# Patient Record
Sex: Female | Born: 1935 | Race: White | Hispanic: No
Health system: Southern US, Community
[De-identification: ages and names within clinical notes are randomized; demographics above are authoritative.]

---

## 2008-01-27 ENCOUNTER — Encounter: Payer: Self-pay | Admitting: Pulmonary Disease

## 2008-02-01 ENCOUNTER — Encounter: Payer: Self-pay | Admitting: Pulmonary Disease

## 2008-02-04 ENCOUNTER — Encounter: Payer: Self-pay | Admitting: Pulmonary Disease

## 2008-02-08 ENCOUNTER — Ambulatory Visit: Payer: Self-pay | Admitting: Cardiology

## 2008-02-08 LAB — CONVERTED CEMR LAB
BUN: 45 mg/dL — ABNORMAL HIGH (ref 6–23)
Creatinine, Ser: 1.4 mg/dL — ABNORMAL HIGH (ref 0.4–1.2)
Digitoxin Lvl: 1.1 ng/mL (ref 0.8–2.0)
GFR calc Af Amer: 46 mL/min
GFR calc non Af Amer: 38 mL/min
Glucose, Bld: 107 mg/dL — ABNORMAL HIGH (ref 70–99)
Potassium: 4.8 meq/L (ref 3.5–5.1)
Sodium: 144 meq/L (ref 135–145)

## 2008-02-11 ENCOUNTER — Ambulatory Visit: Payer: Self-pay | Admitting: Internal Medicine

## 2008-02-11 ENCOUNTER — Ambulatory Visit: Payer: Self-pay

## 2008-02-11 LAB — CONVERTED CEMR LAB
BUN: 75 mg/dL — ABNORMAL HIGH (ref 6–23)
CO2: 25 meq/L (ref 19–32)
Glucose, Bld: 136 mg/dL — ABNORMAL HIGH (ref 70–99)
INR: 4.7 — ABNORMAL HIGH (ref 0.8–1.0)
Potassium: 5 meq/L (ref 3.5–5.1)

## 2008-02-14 ENCOUNTER — Ambulatory Visit: Payer: Self-pay | Admitting: Cardiology

## 2008-02-16 ENCOUNTER — Ambulatory Visit: Payer: Self-pay | Admitting: Cardiology

## 2008-02-23 ENCOUNTER — Ambulatory Visit: Payer: Self-pay | Admitting: Pulmonary Disease

## 2008-02-23 ENCOUNTER — Ambulatory Visit: Payer: Self-pay | Admitting: Internal Medicine

## 2008-02-23 DIAGNOSIS — J310 Chronic rhinitis: Secondary | ICD-10-CM | POA: Insufficient documentation

## 2008-02-23 DIAGNOSIS — J449 Chronic obstructive pulmonary disease, unspecified: Secondary | ICD-10-CM | POA: Insufficient documentation

## 2008-02-23 DIAGNOSIS — J4489 Other specified chronic obstructive pulmonary disease: Secondary | ICD-10-CM | POA: Insufficient documentation

## 2008-03-02 ENCOUNTER — Ambulatory Visit: Payer: Self-pay | Admitting: Internal Medicine

## 2008-03-07 ENCOUNTER — Ambulatory Visit: Payer: Self-pay | Admitting: Cardiology

## 2008-03-07 LAB — CONVERTED CEMR LAB
CO2: 27 meq/L (ref 19–32)
Chloride: 109 meq/L (ref 96–112)
Creatinine, Ser: 1.2 mg/dL (ref 0.4–1.2)
Free T4: 0.8 ng/dL (ref 0.6–1.6)
Potassium: 4.6 meq/L (ref 3.5–5.1)

## 2008-04-30 DIAGNOSIS — I10 Essential (primary) hypertension: Secondary | ICD-10-CM | POA: Insufficient documentation

## 2008-04-30 DIAGNOSIS — I2510 Atherosclerotic heart disease of native coronary artery without angina pectoris: Secondary | ICD-10-CM | POA: Insufficient documentation

## 2008-04-30 DIAGNOSIS — I4891 Unspecified atrial fibrillation: Secondary | ICD-10-CM | POA: Insufficient documentation

## 2008-04-30 DIAGNOSIS — I5032 Chronic diastolic (congestive) heart failure: Secondary | ICD-10-CM | POA: Insufficient documentation

## 2008-04-30 DIAGNOSIS — N189 Chronic kidney disease, unspecified: Secondary | ICD-10-CM | POA: Insufficient documentation

## 2008-06-20 ENCOUNTER — Encounter: Payer: Self-pay | Admitting: Internal Medicine

## 2008-06-27 ENCOUNTER — Encounter: Payer: Self-pay | Admitting: Internal Medicine

## 2008-06-30 ENCOUNTER — Encounter: Payer: Self-pay | Admitting: Internal Medicine

## 2008-07-13 ENCOUNTER — Encounter: Payer: Self-pay | Admitting: Internal Medicine

## 2008-07-24 ENCOUNTER — Encounter: Payer: Self-pay | Admitting: Internal Medicine

## 2008-11-03 ENCOUNTER — Telehealth: Payer: Self-pay | Admitting: Cardiology

## 2008-11-09 ENCOUNTER — Ambulatory Visit: Payer: Self-pay | Admitting: Internal Medicine

## 2008-11-09 LAB — CONVERTED CEMR LAB
Eosinophils Absolute: 0.2 10*3/uL (ref 0.0–0.7)
Lymphocytes Relative: 5.6 % — ABNORMAL LOW (ref 12.0–46.0)
Lymphs Abs: 0.5 10*3/uL — ABNORMAL LOW (ref 0.7–4.0)
MCV: 86 fL (ref 78.0–100.0)
Monocytes Absolute: 0.7 10*3/uL (ref 0.1–1.0)
Monocytes Relative: 7.6 % (ref 3.0–12.0)
Neutro Abs: 7.5 10*3/uL (ref 1.4–7.7)
Neutrophils Relative %: 85.1 % — ABNORMAL HIGH (ref 43.0–77.0)
RDW: 16 % — ABNORMAL HIGH (ref 11.5–14.6)

## 2008-11-10 ENCOUNTER — Ambulatory Visit: Payer: Self-pay | Admitting: Internal Medicine

## 2008-11-10 ENCOUNTER — Inpatient Hospital Stay (HOSPITAL_COMMUNITY): Admission: EM | Admit: 2008-11-10 | Discharge: 2008-11-17 | Payer: Self-pay | Admitting: Emergency Medicine

## 2008-11-10 ENCOUNTER — Encounter: Payer: Self-pay | Admitting: Internal Medicine

## 2008-11-10 ENCOUNTER — Ambulatory Visit: Payer: Self-pay | Admitting: Cardiovascular Disease

## 2008-11-10 ENCOUNTER — Encounter (INDEPENDENT_AMBULATORY_CARE_PROVIDER_SITE_OTHER): Payer: Self-pay | Admitting: Internal Medicine

## 2008-11-13 ENCOUNTER — Telehealth: Payer: Self-pay | Admitting: Internal Medicine

## 2008-11-15 ENCOUNTER — Ambulatory Visit: Payer: Self-pay | Admitting: Physical Medicine & Rehabilitation

## 2008-11-21 ENCOUNTER — Telehealth: Payer: Self-pay | Admitting: Internal Medicine

## 2008-11-22 ENCOUNTER — Telehealth: Payer: Self-pay | Admitting: Internal Medicine

## 2008-11-27 ENCOUNTER — Encounter: Payer: Self-pay | Admitting: Internal Medicine

## 2008-11-30 ENCOUNTER — Telehealth (INDEPENDENT_AMBULATORY_CARE_PROVIDER_SITE_OTHER): Payer: Self-pay | Admitting: *Deleted

## 2008-12-04 ENCOUNTER — Encounter: Payer: Self-pay | Admitting: Internal Medicine

## 2008-12-10 ENCOUNTER — Encounter: Payer: Self-pay | Admitting: Internal Medicine

## 2008-12-12 ENCOUNTER — Ambulatory Visit: Payer: Self-pay | Admitting: Internal Medicine

## 2008-12-14 ENCOUNTER — Encounter: Payer: Self-pay | Admitting: Internal Medicine

## 2008-12-15 ENCOUNTER — Telehealth: Payer: Self-pay | Admitting: Internal Medicine

## 2008-12-16 ENCOUNTER — Encounter: Payer: Self-pay | Admitting: Internal Medicine

## 2008-12-18 ENCOUNTER — Ambulatory Visit: Payer: Self-pay | Admitting: Internal Medicine

## 2008-12-18 ENCOUNTER — Telehealth (INDEPENDENT_AMBULATORY_CARE_PROVIDER_SITE_OTHER): Payer: Self-pay | Admitting: *Deleted

## 2008-12-18 LAB — CONVERTED CEMR LAB

## 2008-12-20 ENCOUNTER — Encounter: Payer: Self-pay | Admitting: Internal Medicine

## 2008-12-26 ENCOUNTER — Telehealth (INDEPENDENT_AMBULATORY_CARE_PROVIDER_SITE_OTHER): Payer: Self-pay | Admitting: *Deleted

## 2008-12-27 ENCOUNTER — Encounter: Payer: Self-pay | Admitting: Internal Medicine

## 2008-12-28 ENCOUNTER — Inpatient Hospital Stay (HOSPITAL_COMMUNITY): Admission: EM | Admit: 2008-12-28 | Discharge: 2009-01-09 | Payer: Self-pay | Admitting: Emergency Medicine

## 2008-12-28 ENCOUNTER — Ambulatory Visit: Payer: Self-pay | Admitting: Cardiology

## 2009-01-02 ENCOUNTER — Encounter: Payer: Self-pay | Admitting: Internal Medicine

## 2009-01-02 ENCOUNTER — Telehealth: Payer: Self-pay | Admitting: Internal Medicine

## 2009-01-31 ENCOUNTER — Encounter: Payer: Self-pay | Admitting: Internal Medicine

## 2009-02-02 ENCOUNTER — Encounter (INDEPENDENT_AMBULATORY_CARE_PROVIDER_SITE_OTHER): Payer: Self-pay | Admitting: *Deleted

## 2009-02-05 ENCOUNTER — Encounter: Payer: Self-pay | Admitting: Internal Medicine

## 2009-02-05 ENCOUNTER — Telehealth: Payer: Self-pay | Admitting: Internal Medicine

## 2009-02-12 ENCOUNTER — Encounter: Payer: Self-pay | Admitting: Cardiology

## 2009-02-14 ENCOUNTER — Encounter: Payer: Self-pay | Admitting: Internal Medicine

## 2009-02-16 ENCOUNTER — Inpatient Hospital Stay (HOSPITAL_COMMUNITY): Admission: EM | Admit: 2009-02-16 | Discharge: 2009-02-20 | Payer: Self-pay | Admitting: Emergency Medicine

## 2009-02-19 ENCOUNTER — Telehealth: Payer: Self-pay | Admitting: Internal Medicine

## 2009-03-07 ENCOUNTER — Encounter: Payer: Self-pay | Admitting: Internal Medicine

## 2009-03-12 ENCOUNTER — Encounter: Payer: Self-pay | Admitting: Internal Medicine

## 2009-03-19 DEATH — deceased

## 2010-08-22 LAB — DIFFERENTIAL
Basophils Absolute: 0 10*3/uL (ref 0.0–0.1)
Basophils Relative: 1 % (ref 0–1)
Eosinophils Absolute: 0 10*3/uL (ref 0.0–0.7)
Eosinophils Absolute: 0.1 10*3/uL (ref 0.0–0.7)
Eosinophils Absolute: 0.4 10*3/uL (ref 0.0–0.7)
Eosinophils Relative: 0 % (ref 0–5)
Eosinophils Relative: 1 % (ref 0–5)
Lymphocytes Relative: 2 % — ABNORMAL LOW (ref 12–46)
Lymphocytes Relative: 6 % — ABNORMAL LOW (ref 12–46)
Lymphs Abs: 0.6 10*3/uL — ABNORMAL LOW (ref 0.7–4.0)
Lymphs Abs: 0.6 10*3/uL — ABNORMAL LOW (ref 0.7–4.0)
Monocytes Absolute: 1 10*3/uL (ref 0.1–1.0)
Monocytes Relative: 4 % (ref 3–12)
Neutro Abs: 8.3 10*3/uL — ABNORMAL HIGH (ref 1.7–7.7)
Neutrophils Relative %: 79 % — ABNORMAL HIGH (ref 43–77)
Neutrophils Relative %: 89 % — ABNORMAL HIGH (ref 43–77)

## 2010-08-22 LAB — COMPREHENSIVE METABOLIC PANEL
ALT: 10 U/L (ref 0–35)
ALT: 12 U/L (ref 0–35)
AST: 23 U/L (ref 0–37)
Albumin: 3.3 g/dL — ABNORMAL LOW (ref 3.5–5.2)
Alkaline Phosphatase: 73 U/L (ref 39–117)
BUN: 11 mg/dL (ref 6–23)
CO2: 29 mEq/L (ref 19–32)
CO2: 32 mEq/L (ref 19–32)
Calcium: 8.8 mg/dL (ref 8.4–10.5)
Chloride: 102 mEq/L (ref 96–112)
Chloride: 96 mEq/L (ref 96–112)
Creatinine, Ser: 1.28 mg/dL — ABNORMAL HIGH (ref 0.4–1.2)
GFR calc Af Amer: 48 mL/min — ABNORMAL LOW (ref >60)
GFR calc non Af Amer: 39 mL/min — ABNORMAL LOW (ref >60)
GFR calc non Af Amer: 49 mL/min — ABNORMAL LOW (ref >60)
Glucose, Bld: 109 mg/dL — ABNORMAL HIGH (ref 70–99)
Potassium: 3.4 mEq/L — ABNORMAL LOW (ref 3.5–5.1)
Sodium: 139 mEq/L (ref 135–145)
Total Bilirubin: 0.7 mg/dL (ref 0.3–1.2)
Total Bilirubin: 0.9 mg/dL (ref 0.3–1.2)
Total Protein: 6 g/dL (ref 6.0–8.3)

## 2010-08-22 LAB — CBC
HCT: 31.3 % — ABNORMAL LOW (ref 36.0–46.0)
HCT: 34.9 % — ABNORMAL LOW (ref 36.0–46.0)
Hemoglobin: 10.4 g/dL — ABNORMAL LOW (ref 12.0–15.0)
Hemoglobin: 11.6 g/dL — ABNORMAL LOW (ref 12.0–15.0)
MCHC: 33.2 g/dL (ref 30.0–36.0)
Platelets: 322 10*3/uL (ref 150–400)
Platelets: 338 10*3/uL (ref 150–400)
RBC: 3.64 MIL/uL — ABNORMAL LOW (ref 3.87–5.11)
RBC: 4.05 MIL/uL (ref 3.87–5.11)
RBC: 4.24 MIL/uL (ref 3.87–5.11)
RDW: 15.5 % (ref 11.5–15.5)
RDW: 15.5 % (ref 11.5–15.5)
WBC: 25.6 10*3/uL — ABNORMAL HIGH (ref 4.0–10.5)

## 2010-08-22 LAB — URINALYSIS, ROUTINE W REFLEX MICROSCOPIC
Bilirubin Urine: NEGATIVE
Glucose, UA: NEGATIVE mg/dL
Ketones, ur: 15 mg/dL — AB
Nitrite: NEGATIVE
Specific Gravity, Urine: 1.021 (ref 1.005–1.030)
Urobilinogen, UA: 1 mg/dL (ref 0.0–1.0)

## 2010-08-22 LAB — URINE MICROSCOPIC-ADD ON

## 2010-08-22 LAB — BASIC METABOLIC PANEL
BUN: 18 mg/dL (ref 6–23)
CO2: 29 mEq/L (ref 19–32)
Chloride: 104 mEq/L (ref 96–112)
Creatinine, Ser: 1.09 mg/dL (ref 0.4–1.2)

## 2010-08-22 LAB — POCT CARDIAC MARKERS: CKMB, poc: <1 ng/mL — ABNORMAL LOW (ref 1.0–8.0)

## 2010-08-22 LAB — MAGNESIUM: Magnesium: 1.5 mg/dL (ref 1.5–2.5)

## 2010-08-24 LAB — COMPREHENSIVE METABOLIC PANEL
ALT: 11 U/L (ref 0–35)
ALT: 13 U/L (ref 0–35)
AST: 24 U/L (ref 0–37)
Albumin: 3 g/dL — ABNORMAL LOW (ref 3.5–5.2)
Albumin: 3.1 g/dL — ABNORMAL LOW (ref 3.5–5.2)
Albumin: 3.2 g/dL — ABNORMAL LOW (ref 3.5–5.2)
Alkaline Phosphatase: 50 U/L (ref 39–117)
Alkaline Phosphatase: 59 U/L (ref 39–117)
BUN: 25 mg/dL — ABNORMAL HIGH (ref 6–23)
CO2: 33 mEq/L — ABNORMAL HIGH (ref 19–32)
Calcium: 8.1 mg/dL — ABNORMAL LOW (ref 8.4–10.5)
Calcium: 8.6 mg/dL (ref 8.4–10.5)
Creatinine, Ser: 1.24 mg/dL — ABNORMAL HIGH (ref 0.4–1.2)
Creatinine, Ser: 1.24 mg/dL — ABNORMAL HIGH (ref 0.4–1.2)
GFR calc Af Amer: 49 mL/min — ABNORMAL LOW (ref >60)
GFR calc non Af Amer: 36 mL/min — ABNORMAL LOW (ref >60)
GFR calc non Af Amer: 41 mL/min — ABNORMAL LOW (ref >60)
Glucose, Bld: 115 mg/dL — ABNORMAL HIGH (ref 70–99)
Glucose, Bld: 124 mg/dL — ABNORMAL HIGH (ref 70–99)
Potassium: 3.3 mEq/L — ABNORMAL LOW (ref 3.5–5.1)
Sodium: 136 mEq/L (ref 135–145)
Sodium: 140 mEq/L (ref 135–145)
Total Bilirubin: 0.5 mg/dL (ref 0.3–1.2)
Total Bilirubin: 0.7 mg/dL (ref 0.3–1.2)
Total Protein: 5.6 g/dL — ABNORMAL LOW (ref 6.0–8.3)
Total Protein: 5.8 g/dL — ABNORMAL LOW (ref 6.0–8.3)

## 2010-08-24 LAB — DIFFERENTIAL
Basophils Absolute: 0 10*3/uL (ref 0.0–0.1)
Eosinophils Absolute: 0 10*3/uL (ref 0.0–0.7)
Lymphs Abs: 0.8 10*3/uL (ref 0.7–4.0)
Monocytes Relative: 5 % (ref 3–12)

## 2010-08-24 LAB — URINALYSIS, ROUTINE W REFLEX MICROSCOPIC
Bilirubin Urine: NEGATIVE
Glucose, UA: NEGATIVE mg/dL
Hgb urine dipstick: NEGATIVE
Ketones, ur: NEGATIVE mg/dL
Protein, ur: 100 mg/dL — AB
Urobilinogen, UA: 1 mg/dL (ref 0.0–1.0)

## 2010-08-24 LAB — BASIC METABOLIC PANEL
BUN: 17 mg/dL (ref 6–23)
BUN: 19 mg/dL (ref 6–23)
BUN: 21 mg/dL (ref 6–23)
CO2: 31 mEq/L (ref 19–32)
CO2: 33 mEq/L — ABNORMAL HIGH (ref 19–32)
CO2: 30 mEq/L (ref 19–32)
Calcium: 8.6 mg/dL (ref 8.4–10.5)
Calcium: 8.8 mg/dL (ref 8.4–10.5)
Chloride: 97 mEq/L (ref 96–112)
Chloride: 98 mEq/L (ref 96–112)
Creatinine, Ser: 1.21 mg/dL — ABNORMAL HIGH (ref 0.4–1.2)
Creatinine, Ser: 1.22 mg/dL — ABNORMAL HIGH (ref 0.4–1.2)
Creatinine, Ser: 1.24 mg/dL — ABNORMAL HIGH (ref 0.4–1.2)
GFR calc non Af Amer: 41 mL/min — ABNORMAL LOW (ref >60)
Glucose, Bld: 107 mg/dL — ABNORMAL HIGH (ref 70–99)
Glucose, Bld: 107 mg/dL — ABNORMAL HIGH (ref 70–99)
Glucose, Bld: 114 mg/dL — ABNORMAL HIGH (ref 70–99)
Glucose, Bld: 101 mg/dL — ABNORMAL HIGH (ref 70–99)
Potassium: 4.1 mEq/L (ref 3.5–5.1)
Potassium: 3.5 mEq/L (ref 3.5–5.1)
Sodium: 139 mEq/L (ref 135–145)

## 2010-08-24 LAB — CROSSMATCH
ABO/RH(D): A POS
Antibody Screen: NEGATIVE

## 2010-08-24 LAB — CBC
HCT: 27.2 % — ABNORMAL LOW (ref 36.0–46.0)
Hemoglobin: 5.9 g/dL — CL (ref 12.0–15.0)
Hemoglobin: 7.9 g/dL — CL (ref 12.0–15.0)
MCHC: 33.4 g/dL (ref 30.0–36.0)
MCHC: 33.4 g/dL (ref 30.0–36.0)
MCHC: 33.5 g/dL (ref 30.0–36.0)
MCV: 92.7 fL (ref 78.0–100.0)
Platelets: 268 10*3/uL (ref 150–400)
Platelets: 280 10*3/uL (ref 150–400)
Platelets: 318 10*3/uL (ref 150–400)
RBC: 2.54 MIL/uL — ABNORMAL LOW (ref 3.87–5.11)
RBC: 3.65 MIL/uL — ABNORMAL LOW (ref 3.87–5.11)
RDW: 16.7 % — ABNORMAL HIGH (ref 11.5–15.5)
RDW: 21.1 % — ABNORMAL HIGH (ref 11.5–15.5)
WBC: 10.6 10*3/uL — ABNORMAL HIGH (ref 4.0–10.5)
WBC: 11.2 10*3/uL — ABNORMAL HIGH (ref 4.0–10.5)
WBC: 11.6 10*3/uL — ABNORMAL HIGH (ref 4.0–10.5)

## 2010-08-24 LAB — BRAIN NATRIURETIC PEPTIDE: Pro B Natriuretic peptide (BNP): 233 pg/mL — ABNORMAL HIGH (ref 0.0–100.0)

## 2010-08-24 LAB — HEMOGLOBIN AND HEMATOCRIT, BLOOD
HCT: 23.4 % — ABNORMAL LOW (ref 36.0–46.0)
HCT: 27.9 % — ABNORMAL LOW (ref 36.0–46.0)
Hemoglobin: 7.6 g/dL — CL (ref 12.0–15.0)

## 2010-08-24 LAB — URINE MICROSCOPIC-ADD ON

## 2010-08-24 LAB — APTT: aPTT: 44 seconds — ABNORMAL HIGH (ref 24–37)

## 2010-08-24 LAB — PROTIME-INR
INR: 1.2 (ref 0.00–1.49)
INR: 3.7 — ABNORMAL HIGH (ref 0.00–1.49)

## 2010-08-24 LAB — TSH: TSH: 1.135 u[IU]/mL (ref 0.350–4.500)

## 2010-08-24 LAB — DIGOXIN LEVEL: Digoxin Level: 1.4 ng/mL (ref 0.8–2.0)

## 2010-08-24 LAB — ABO/RH: ABO/RH(D): A POS

## 2010-08-25 LAB — CBC
HCT: 30.9 % — ABNORMAL LOW (ref 36.0–46.0)
MCHC: 33.9 g/dL (ref 30.0–36.0)
MCV: 86.4 fL (ref 78.0–100.0)
Platelets: 425 10*3/uL — ABNORMAL HIGH (ref 150–400)
RDW: 16.7 % — ABNORMAL HIGH (ref 11.5–15.5)

## 2010-08-25 LAB — BASIC METABOLIC PANEL
BUN: 23 mg/dL (ref 6–23)
BUN: 28 mg/dL — ABNORMAL HIGH (ref 6–23)
CO2: 28 mEq/L (ref 19–32)
CO2: 29 mEq/L (ref 19–32)
Calcium: 8.5 mg/dL (ref 8.4–10.5)
Chloride: 99 mEq/L (ref 96–112)
Creatinine, Ser: 0.87 mg/dL (ref 0.4–1.2)
GFR calc non Af Amer: 49 mL/min — ABNORMAL LOW (ref >60)
Glucose, Bld: 83 mg/dL (ref 70–99)
Glucose, Bld: 92 mg/dL (ref 70–99)
Potassium: 3.4 mEq/L — ABNORMAL LOW (ref 3.5–5.1)
Sodium: 142 mEq/L (ref 135–145)

## 2010-08-25 LAB — PROTIME-INR: Prothrombin Time: 21.6 seconds — ABNORMAL HIGH (ref 11.6–15.2)

## 2010-08-26 LAB — DIFFERENTIAL
Basophils Absolute: 0 10*3/uL (ref 0.0–0.1)
Basophils Absolute: 0 10*3/uL (ref 0.0–0.1)
Basophils Relative: 0 % (ref 0–1)
Eosinophils Absolute: 0 10*3/uL (ref 0.0–0.7)
Eosinophils Absolute: 0.1 10*3/uL (ref 0.0–0.7)
Eosinophils Relative: 0 % (ref 0–5)
Eosinophils Relative: 1 % (ref 0–5)
Lymphocytes Relative: 2 % — ABNORMAL LOW (ref 12–46)
Monocytes Absolute: 0.5 10*3/uL (ref 0.1–1.0)
Neutrophils Relative %: 87 % — ABNORMAL HIGH (ref 43–77)

## 2010-08-26 LAB — BASIC METABOLIC PANEL
BUN: 27 mg/dL — ABNORMAL HIGH (ref 6–23)
BUN: 34 mg/dL — ABNORMAL HIGH (ref 6–23)
CO2: 28 mEq/L (ref 19–32)
CO2: 30 mEq/L (ref 19–32)
CO2: 31 mEq/L (ref 19–32)
CO2: 32 mEq/L (ref 19–32)
CO2: 32 mEq/L (ref 19–32)
Calcium: 8.3 mg/dL — ABNORMAL LOW (ref 8.4–10.5)
Calcium: 9.4 mg/dL (ref 8.4–10.5)
Chloride: 101 mEq/L (ref 96–112)
Chloride: 103 mEq/L (ref 96–112)
Chloride: 106 mEq/L (ref 96–112)
Chloride: 97 mEq/L (ref 96–112)
Creatinine, Ser: 0.98 mg/dL (ref 0.4–1.2)
Creatinine, Ser: 1.21 mg/dL — ABNORMAL HIGH (ref 0.4–1.2)
GFR calc Af Amer: 51 mL/min — ABNORMAL LOW (ref >60)
GFR calc Af Amer: 52 mL/min — ABNORMAL LOW (ref >60)
GFR calc non Af Amer: 53 mL/min — ABNORMAL LOW (ref >60)
Glucose, Bld: 114 mg/dL — ABNORMAL HIGH (ref 70–99)
Glucose, Bld: 126 mg/dL — ABNORMAL HIGH (ref 70–99)
Glucose, Bld: 160 mg/dL — ABNORMAL HIGH (ref 70–99)
Glucose, Bld: 168 mg/dL — ABNORMAL HIGH (ref 70–99)
Glucose, Bld: 97 mg/dL (ref 70–99)
Potassium: 3.9 mEq/L (ref 3.5–5.1)
Potassium: 3.9 mEq/L (ref 3.5–5.1)
Sodium: 138 mEq/L (ref 135–145)
Sodium: 140 mEq/L (ref 135–145)
Sodium: 144 mEq/L (ref 135–145)

## 2010-08-26 LAB — CBC
HCT: 30.4 % — ABNORMAL LOW (ref 36.0–46.0)
HCT: 31.3 % — ABNORMAL LOW (ref 36.0–46.0)
HCT: 31.5 % — ABNORMAL LOW (ref 36.0–46.0)
Hemoglobin: 10.4 g/dL — ABNORMAL LOW (ref 12.0–15.0)
Hemoglobin: 10.4 g/dL — ABNORMAL LOW (ref 12.0–15.0)
Hemoglobin: 10.5 g/dL — ABNORMAL LOW (ref 12.0–15.0)
Hemoglobin: 11 g/dL — ABNORMAL LOW (ref 12.0–15.0)
Hemoglobin: 11.5 g/dL — ABNORMAL LOW (ref 12.0–15.0)
MCHC: 33.2 g/dL (ref 30.0–36.0)
MCHC: 33.3 g/dL (ref 30.0–36.0)
MCHC: 33.3 g/dL (ref 30.0–36.0)
MCHC: 33.4 g/dL (ref 30.0–36.0)
MCHC: 33.5 g/dL (ref 30.0–36.0)
MCV: 85.1 fL (ref 78.0–100.0)
MCV: 85.7 fL (ref 78.0–100.0)
MCV: 86.4 fL (ref 78.0–100.0)
MCV: 86.6 fL (ref 78.0–100.0)
MCV: 86.8 fL (ref 78.0–100.0)
Platelets: 414 10*3/uL — ABNORMAL HIGH (ref 150–400)
Platelets: 502 10*3/uL — ABNORMAL HIGH (ref 150–400)
Platelets: 523 10*3/uL — ABNORMAL HIGH (ref 150–400)
RBC: 3.62 MIL/uL — ABNORMAL LOW (ref 3.87–5.11)
RBC: 3.65 MIL/uL — ABNORMAL LOW (ref 3.87–5.11)
RBC: 3.8 MIL/uL — ABNORMAL LOW (ref 3.87–5.11)
RDW: 16.5 % — ABNORMAL HIGH (ref 11.5–15.5)
RDW: 16.7 % — ABNORMAL HIGH (ref 11.5–15.5)
RDW: 16.7 % — ABNORMAL HIGH (ref 11.5–15.5)
RDW: 16.9 % — ABNORMAL HIGH (ref 11.5–15.5)
RDW: 17.2 % — ABNORMAL HIGH (ref 11.5–15.5)
WBC: 10 10*3/uL (ref 4.0–10.5)
WBC: 15.6 10*3/uL — ABNORMAL HIGH (ref 4.0–10.5)

## 2010-08-26 LAB — HEMOGLOBIN A1C
Hgb A1c MFr Bld: 5.6 % (ref 4.6–6.1)
Mean Plasma Glucose: 114 mg/dL

## 2010-08-26 LAB — PROTIME-INR
Prothrombin Time: 37.4 seconds — ABNORMAL HIGH (ref 11.6–15.2)
Prothrombin Time: 43.8 seconds — ABNORMAL HIGH (ref 11.6–15.2)

## 2010-08-26 LAB — URINE CULTURE: Colony Count: NO GROWTH

## 2010-08-26 LAB — BLOOD GAS, ARTERIAL
Acid-Base Excess: 3.3 mmol/L — ABNORMAL HIGH (ref 0.0–2.0)
Bicarbonate: 25.5 mEq/L — ABNORMAL HIGH (ref 20.0–24.0)
Drawn by: 317871
O2 Content: 4 L/min
O2 Saturation: 86.7 %
pCO2 arterial: 30.7 mmHg — ABNORMAL LOW (ref 35.0–45.0)
pO2, Arterial: 49.7 mmHg — ABNORMAL LOW (ref 80.0–100.0)

## 2010-08-26 LAB — CARDIAC PANEL(CRET KIN+CKTOT+MB+TROPI)
CK, MB: 1 ng/mL (ref 0.3–4.0)
Relative Index: INVALID (ref 0.0–2.5)
Relative Index: INVALID (ref 0.0–2.5)
Total CK: 24 U/L (ref 7–177)
Troponin I: 0.02 ng/mL (ref 0.00–0.06)

## 2010-08-26 LAB — COMPREHENSIVE METABOLIC PANEL
ALT: 19 U/L (ref 0–35)
AST: 25 U/L (ref 0–37)
CO2: 26 mEq/L (ref 19–32)
Chloride: 102 mEq/L (ref 96–112)
GFR calc Af Amer: >60 mL/min (ref >60)
GFR calc non Af Amer: >60 mL/min (ref >60)
Glucose, Bld: 130 mg/dL — ABNORMAL HIGH (ref 70–99)
Sodium: 139 mEq/L (ref 135–145)
Total Bilirubin: 0.7 mg/dL (ref 0.3–1.2)

## 2010-08-26 LAB — CK TOTAL AND CKMB (NOT AT ARMC)
CK, MB: 1.1 ng/mL (ref 0.3–4.0)
Relative Index: INVALID (ref 0.0–2.5)

## 2010-08-26 LAB — D-DIMER, QUANTITATIVE: D-Dimer, Quant: 0.97 ug/mL-FEU — ABNORMAL HIGH (ref 0.00–0.48)

## 2010-08-26 LAB — URINALYSIS, ROUTINE W REFLEX MICROSCOPIC
Bilirubin Urine: NEGATIVE
Glucose, UA: NEGATIVE mg/dL
Hgb urine dipstick: NEGATIVE
Specific Gravity, Urine: 1.004 — ABNORMAL LOW (ref 1.005–1.030)
pH: 6 (ref 5.0–8.0)

## 2010-08-26 LAB — BRAIN NATRIURETIC PEPTIDE
Pro B Natriuretic peptide (BNP): 242 pg/mL — ABNORMAL HIGH (ref 0.0–100.0)
Pro B Natriuretic peptide (BNP): 291 pg/mL — ABNORMAL HIGH (ref 0.0–100.0)

## 2010-08-26 LAB — SAMPLE TO BLOOD BANK

## 2010-08-26 LAB — CULTURE, BLOOD (ROUTINE X 2)

## 2010-08-26 LAB — DIGOXIN LEVEL: Digoxin Level: 0.6 ng/mL — ABNORMAL LOW (ref 0.8–2.0)

## 2010-08-26 LAB — TSH: TSH: 0.883 u[IU]/mL (ref 0.350–4.500)

## 2010-10-01 NOTE — Discharge Summary (Signed)
[name], [name]              ACCOUNT NO.:  [id number]   MEDICAL RECORD NO.:  [id number]          PATIENT TYPE:  INP   LOCATION:  1430                         FACILITY:  [facility]   PHYSICIAN:  Kela Millin, M.D.DATE OF BIRTH:  1917/04/15   DATE OF ADMISSION:  11/10/2008  DATE OF DISCHARGE:  11/17/2008                               DISCHARGE SUMMARY   ADDENDUM:  This is an addendum to the discharge summary dictated by Dr.  Ramiro Harvest covering the period from June 25 through June 29.   ADDENDUM TO THE HOSPITAL COURSE:  The rest of her hospital stay from  June 30 through today, July 2, has been uneventful.  The patient's  daughter indicated that she would be unable to provide all the  assistance that she would need at home.  [facility] saw the  patient but stated that she would be best suited for a skilled nursing  facility.  Social worker has assisted with her placement at [facility]  and the patient will be discharged to that facility today.  Her atrial  fibrillation has remained controlled.  She was maintained on Coumadin  and her PT/INR was monitored.  Her INR today was 1.6 even after her  Coumadin dose was increased to 3 mg last p.m.  She has been given a dose  of Lovenox today and she is to continue the Coumadin and have her PT/INR  rechecked in the morning by the nursing home physician and her Coumadin  dose to be adjusted accordingly.  The patient also completed the full  antibiotic course for treatment of her pneumonia and will not be  requiring any further antibiotics upon discharge.   DISCHARGE MEDICATIONS:  1. Coumadin 4 mg p.o. x1 today.  She was previously on Coumadin 2 mg      daily and her PT/INR is to be rechecked tomorrow, November 18, 2008, at      the nursing home and her Coumadin dose to be adjusted accordingly      by the nursing home physician.  2. Zebeta 5 mg p.o. b.i.d.  3. Aspirin 81 mg p.o. daily.  4. Crestor 40 mg p.o. daily.  5. Niaspan  500 mg p.o. q.h.s.  6. Colace 100 mg p.o. b.i.d., hold if diarrhea.  7. Prednisone 30 mg p.o. daily x2 more days then decrease to 20 mg      p.o. q.a.m. with food x3 days then decrease to 10 mg p.o. q.a.m.      with food for 3 days and then stop.  8. DuoNeb q.6 h.  9. Lanoxin 0.125 mg p.o. daily.  10.Advair Diskus 250/50 mcg one puff b.i.d.  11.Lexapro 10 mg p.o. q.h.s.  12.Restoril 7.5 mg p.o. q.h.s. p.r.n.  13.Mucinex 1200 mg p.o. b.i.d.  14.Iron sulfate 325 mg p.o. daily.  15.Tessalon Perles 100 mg p.o. t.i.d. p.r.n. cough.  16.Tylenol 650 mg p.o. q.6 h p.r.n.  17.Prilosec 40 mg p.o. daily.  18.Her Toprol and Tiazac were discontinued during this hospital stay      and the patient is to follow up with cardiology outpatient (her  heart rate was controlled on Zebeta and digoxin as above).   FOLLOWUP CARE:  1. Nursing home physician in a.m., she is to have a PT/INR drawn and      her Coumadin dose adjusted accordingly.  2. Dr. Shirlee Latch in 2-4 weeks, call for appointment.  3. Dr. Jetty Duhamel in 2-3 weeks, call for appointment.  4. Palliative care to follow the patient at the nursing facility.   DISCHARGE CONDITION:  Improved.  Stable.      Kela Millin, M.D.  Electronically Signed     ACV/MEDQ  D:  11/17/2008  T:  11/17/2008  Job:  [id number]   cc:   Marca Ancona, MD  [address]  [place] [address]   Duncan Dull, M.D.  Fax: [phone]

## 2010-10-01 NOTE — Consult Note (Signed)
NAMERANETTE, [name]              ACCOUNT NO.:  [id number]   MEDICAL RECORD NO.:  [id number]          PATIENT TYPE:  INP   LOCATION:  1401                         FACILITY:  [facility]   PHYSICIAN:  Gerrit Friends. Dietrich Pates, MD, FACCDATE OF BIRTH:  1916/11/10   DATE OF CONSULTATION:  12/30/2008  DATE OF DISCHARGE:                                 CONSULTATION   PRIMARY CARE PHYSICIAN:  Dr. Shaune Pollack.   PRIMARY CARDIOLOGIST:  Dr. Marca Ancona.   HISTORY OF PRESENT ILLNESS:  An 77 year old woman with multiple serious  medical problems including chronic atrial fibrillation treated with  anticoagulation until recently, recurrent congestive heart failure with  preserved left ventricular systolic function and chronic lung disease  after cigarette consumption of 60 pack-years, now admitted with severe  anemia and GI blood loss.  [name] has had six hospital admissions  within the past 6 months or so, typically with dyspnea.  She presents  now with extreme weakness and dyspnea and is found to have a hemoglobin  of 5.9.  Her daughters report a melanotic stool a few days prior to  admission.  Most recent hemoglobin in the chart was a value of 10.0 in  July.  [name] has had continuing dyspnea with some acute  exacerbations in the hospital.  Chest x-ray on admission showed  pulmonary edema.  She has had no chest discomfort.  She has experienced  no loss of consciousness.   She also had a history of critical aortic stenosis requiring aortic  valve replacement surgery with a bioprosthetic device approximately 10  years ago.  An echocardiogram within the past few months showed normal  left ventricular systolic function and normal function of her  bioprosthetic valve.  Atrial fibrillation was apparently paroxysmal in  the past, but has now been more persistent.  It is unclear the extent to  which this was contributing to her congestive heart failure.  Since  admission to hospital, there apparently  has been concern about  bradycardia.  Reviewing telemetry, I find no significant pauses and no  sustained slow heart rates.  She has had some rapid heart rate up to 120  - 150.  She has no known significant coronary artery disease - cardiac  catheterization at the time of her AVR showed a 60% RCA lesion.  She has  developed mild renal insufficiency in the past when treated with  diuretics.   PAST MEDICAL HISTORY:  Otherwise notable for a history of carcinoma of  the breast with lumpectomy, right axillary dissection and radiation  therapy.  She has had hypertension.  She has recently required  continuous oxygen therapy.   PAST SURGICAL HISTORY:  1. Aortic valve replacement as noted above.  2. Rhinoplasty  3. Total thyroidectomy.  4. Lumpectomy for carcinoma of the breast in 1999.   ALLERGIES:  [name] has no known allergies.   MEDICATIONS:  As listed in the medical reconciliation sheet.   SOCIAL HISTORY:  Widowed with three children; two of her daughters live  locally and assist with her care.  She is currently a home hospice  patient,  but has not yet accepted DNR status.  There is no history of  excessive use of alcohol.   FAMILY HISTORY:  No prominent coronary artery disease.   REVIEW OF SYSTEMS:  Impaired ambulation of late; able to get around the  house with a walker.  She has had no abdominal pain.  She has had no  hematemesis or bright red blood per rectum.  There is a history of  hyperlipidemia.  She notes occasional pedal edema that is never severe.  She has had easy bruising.  There is a history of GERD for which she  takes a PPI.  She follows a regular diet at home.  She requires  corrective lenses for near vision.  She has a chronic rhinitis.  She has  had depression in the past.  All other systems reviewed and are  negative.   PHYSICAL EXAMINATION:  GENERAL:  Very pleasant, fairly sharp, elderly  woman in mild respiratory distress.  VITAL SIGNS:  Temperature is  98.3, heart rate 78 and irregular,  respirations 20, blood pressure 110/50, O2 saturation 98% on 3 liters.  HEENT:  Temporal wasting; bilateral arcus; normal oral mucosa.  NECK:  Moderate jugular venous distention; normal carotid upstrokes with  bilateral bruits.  ENDOCRINE:  No thyromegaly.  HEMATOPOIETIC:  No in no adenopathy.  SKIN:  Multiple ecchymoses; multiple abrasions; bronzed discoloration.  LUNGS:  Rales one third of the way up bilaterally.  CARDIAC:  Irregular rhythm; normal first and second heart sounds;  minimal systolic murmur.  ABDOMEN:  Soft and nontender; normal bowel sounds; no masses; no  organomegaly.  EXTREMITIES:  Trace edema; normal distal pulses.  NEUROLOGIC:  Symmetrical strength and tone; normal cranial nerves.  PSYCHIATRIC:  Normal affect; engaged in conversation; slight difficulty  finding words and with word pronunciation; some impairment in fluent  speech and memory.   STUDIES:  EKG:  Atrial fibrillation with controlled ventricular  response; ST - T-wave abnormality consistent with digoxin effect.  Chest  x-ray:  Mild pulmonary edema.   LABORATORY DATA:  Hemoglobin of 5.9, increasing to 9.3 after  transfusion, but now down to 7.6; normal metabolic profile except for  potassium of 3.5, a digoxin level of 1.3, a now normal INR; value at  admission was 3.7; negative troponin.   IMPRESSION:  [name] presents with severe anemia, which certainly  could account for all of her symptoms; however, she also has recurrent  pulmonary edema, perhaps induced by the stress of her anemia.  She will  require maintenance of a higher hemoglobin, perhaps approximately 9.  I  would recommend transfusion of two additional units.  She will also  require some diuresis.  Intravenous furosemide will be given today and  oral furosemide started tomorrow.  Her potassium will be replaced.  We  will monitor weight, hemoglobin and renal function.  At present, no  diagnostic  testing is planned for her GI bleeding.  As a result, there  will be no identification or treatment of her underlying GI issue.  Accordingly, I would not be inclined to resume her aspirin or warfarin  at any point in the future.   Congestive heart failure has been present in the past and is apparently  unrelated to her prosthetic valve or left ventricular systolic function.  We will diurese her as tolerated.   Family and I had a long discussion about code status with the patient.  She does not appear inclined to accept that her life span appears to  be  quite limited at the present time.  She was variable in her request for  the type of care she would accept were her condition to deteriorate  seriously.  For now, she is not ready to establish a full no code  status.  We will continue to discuss this issue and to follow this nice  woman with you.      Gerrit Friends. Dietrich Pates, MD, [facility]  Electronically Signed     RMR/MEDQ  D:  12/30/2008  T:  12/31/2008  Job:  [id number]

## 2010-10-01 NOTE — Assessment & Plan Note (Signed)
[facility]                            CARDIOLOGY OFFICE NOTE   [name], [name]                       MRN:          [id number]  DATE:02/08/2008                            DOB:          07-31-1916    PRIMARY CARE PHYSICIAN:  Desmond Dike, MD in [place], [place].   HISTORY OF PRESENT ILLNESS:  This is an 77 year old with multiple  medical problems including a history of bovine aortic valve replacement,  breast cancer and coronary artery disease who presents to cardiology  clinic for evaluation of shortness of breath.  The patient states that  over this summer, she feels like she has been doing considerably worse  than the past.  She says she has felt flu-like symptoms for the last  couple of months.  She has been short of breath if she climbs a flight  of stairs, for about 2 months.  She also has been has been getting short  of breath just doing her daily housework like doing the laundry or  cooking dinner.  This is all quite new for her.  She denies having  episodes of chest pain or pressure.  She does not have orthopnea or PND.  She was evaluated initially by a pulmonologist up in [place] where she  lives.  She has a history of mild emphysema, but the pulmonologist did  not think that her lung issues could explain the degree of shortness of  breath she was having.  He then sent her to follow up with her primary  care physician to look for cardiac causes.  Her primary care physician  did an EKG, this was on this last Friday, noted that she was in atrial  flutter and send her to the emergency department.  Her heart rate at  that time was about 130.  In the emergency department, she was rate-  controlled, started on Lasix, she is already on Coumadin and has been  for the past 2 years and discharged.  She states that she continues to  have the same level of shortness of breath today.  She is coming in this  morning for a followup after  her emergency department visit.  The  patient is 77 years old; however, she has been quite functional.  A year  ago, she was playing golf.  She lives 6 months in [place] and 6 months  in the [facility] and she is followed by a cardiologist  down to [place] in  [place].   MEDICATIONS:  1. Diovan 160 mg daily.  2. Diltiazem extended release 120 mg daily.  3. Digoxin 0.125 mg daily.  4. Coumadin, this is followed by her cardiologist in [place].  She has      her labs drawn in [place] and then sends down to [place].  5. Lexapro 10 mg daily.  6. Toprol-XL 50 mg daily.  7. Niacin 500 mg daily.  8. Crestor 20 mg daily.  9. Lasix 20 mg b.i.d (not taking).   PAST MEDICAL HISTORY:  1. History of severe aortic stenosis, status post aortic valve  replacement with a #19 bovine pericardial valve in 1999.  Most      recent echo was done this month, September 2009, while she is in      the hospital this past weekend showing mild LVH, EF of greater than      55%, moderate left atrial enlargement.  There was some moderate      mitral annular calcification, but no evidence of mitral stenosis,      there was mild tricuspid regurgitation.  Pulmonary artery systolic      pressure was 36 mmHg.  There was a bovine aortic valve replacement      with a mean gradient of 12 mmHg that seemed to be functioning      correctly.  There was mild diastolic dysfunction without evidence      of significantly elevated filling pressures.  2. History of coronary artery disease.  The patient has 60% RCA      stenosis on her pre-AVR cath, I'm not sure if she had a bypass      graft.  She did have an exercise Cardiolite in November 2006, which      showed an EF of 89% and no perfusion defects.  3. History of breast cancer.  The patient had lumpectomy with      radiation, but no chemotherapy.  This is about 10 years ago.  4. Hypertension.  5. Apparently mild emphysema.  6. Diastolic congestive heart  failure.  7. New-onset atrial fibrillation.  The patient has been on Coumadin      since 2006.  I am unsure of the indication for this and she does      not know either.  8. Chronic kidney disease.  Last creatinine was 1.26.   SOCIAL HISTORY:  The patient is widowed.  She lives 6 months of the year  in [place] and 6 months of the year in [place].  She is here with her  son today who lives in [place].  She used to smoke but quit 10 years  ago, drinks occasional alcohol not heavily.  She does have 3 children.   FAMILY HISTORY:  Noncontributory.   REVIEW OF SYSTEMS:  Negative except as noted in history present illness.   LABORATORY DATA:  Hematocrit 34.8.  LFTs normal.  This was done in  August 2009.  Other labs in August 2009, sodium 138, potassium 5.5, BUN  32, creatinine 1.26.  TSH was normal.   EKG today shows atrial fibrillation with evidence of digoxin effect with  a rate in the 80s.   PHYSICAL EXAMINATION:  VITAL SIGNS:  Blood pressure 128/70, heart rate  in the 80s, weight is 151 pounds.  GENERAL:  This is an elderly female in no apparent distress.  NEUROLOGIC:  Alert and oriented x3.  There is normal affect.  NECK:  JVP is approximately 8-9 cm of water.  There is no thyromegaly or  thyroid nodule.  CARDIOVASCULAR:  Heart irregular S1 and S2.  No S3.  No S4.  There is a  2/6 systolic crescendo-decrescendo murmur at the right upper sternal  border and there is also a 2/6 murmur at the apex.  There is radiation  of the murmur into in the neck bilaterally.  LUNGS:  Clear to auscultation bilaterally.  EXTREMITIES:  There is no clubbing or cyanosis.  There is trace ankle  edema bilaterally.  HEENT:  Normal exam.  NEUROLOGIC:  Normal exam.  SKIN:  Normal exam.   ASSESSMENT AND PLAN:  This is an 77 year old with history of aortic  valve replacement, coronary artery disease, emphysema who presents to  cardiology clinic for evaluation of new-onset atrial fibrillation with   significant shortness of breath symptoms.  1. Atrial fibrillation.  The patient presents with new-onset atrial      fibrillation.  She is on rate control with diltiazem, digoxin and      Toprol-XL.  Her heart rate is in the 80s-90s on my evaluation this      morning at rest.  She does continue to be significantly short of      breath.  This has been going on for the last couple of months and I      do wonder if this is indeed related to new-onset atrial      fibrillation in the setting of diastolic dysfunction leading to      diastolic congestive heart failure.  As for how to manage this      atrial fibrillation, we will definitely anticoagulate her.  She      actually is already on Coumadin, which she has been on since 2006      [place]      We will continue her on that.  Her rate control at rest is not      quite as good as it should be.  I have suggested today that we      increase her diltiazem ER 240 mg a day, continuing her on her      digoxin and her Toprol-XL to attempt a better rate control.      Cardioversion for her may not be the best idea as on her last      echocardiogram, she has moderate mitral regurgitation, and moderate      left atrial enlargement.  I think that the likelihood is if we      cardioverted her this atrial fibrillation would probably recur in      short order.  We could put her antiarrhythmic medication,  however,      I do not think that I would want to expose her to the potential      toxicities unless we absolute had to.  I think, at this time we      will try to rate control her and see if appropriate rate control      helps her feel somewhat better.  If symptoms remain intractable, we      could attempt a TEE-guided cardioversion; however, this would not      be my first choice.  2. Diastolic congestive heart failure.  This is likely exacerbated by      new-onset atrial fibrillation.  We will  attempt to adequately rate      control the atrial fibrillation.  She was started on Lasix in the      hospital over the weekend; however, she has not been taking it.      She has been ordered for Lasix 20 mg b.i.d.  I will have her      instead take Lasix 40 mg once in the morning.  We will see if this      helps make her feel better.  We will also check a BNP today  3. Bioprosthetic aortic valve.  This valve appeared to be functioning      normally with a normal gradient on an echocardiogram done over the  weekend.  4. Coronary artery disease.  The patient has had significant new-onset      shortness of breath as well as new onset of atrial fibrillation.      She did have coronary artery disease on the cath in 1999, last      stress test was in 2006.  I think, it would be reasonable to obtain      an adenosine Myoview to assess her for any significant perfusion      defects.  We will go ahead and set that up for this week.  5. Chronic kidney disease.  The patient's creatinine was 1.26 back in      August.  I think, we need to repeat that again today especially as      we are going to attempt diuresis with her.     Marca Ancona, MD  Electronically Signed    DM/MedQ  DD: 02/08/2008  DT: 02/08/2008  Job #: [id number]   cc:   Thomas C. Wall, MD, [facility]

## 2010-10-01 NOTE — Discharge Summary (Signed)
[name], [name] NO.:  [id number]   MEDICAL RECORD NO.:  [id number]          PATIENT TYPE:  INP   LOCATION:  1430                         FACILITY:  [facility]   PHYSICIAN:  Ramiro Harvest, MD    DATE OF BIRTH:  [birth year]   DATE OF ADMISSION:  11/10/2008  DATE OF DISCHARGE:                               DISCHARGE SUMMARY   INTERIM DISCHARGE SUMMARY:  This is an interim discharge summary  covering the events between November 10, 2008, through November 14, 2008.   PRIMARY CARE PHYSICIAN:  Duncan Dull, M.D., of [facility].   DISCHARGE DIAGNOSES:  1. Acute hypoxic respiratory failure, multifactorial in nature,      secondary to chronic obstructive pulmonary disease exacerbation,      acute on chronic diastolic heart failure, pneumonia and atrial      fibrillation.  2. Acute on chronic diastolic heart failure.  3. Atrial fibrillation with rapid ventricular response.  4. Pneumonia.  5. Chronic obstructive pulmonary disease exacerbation.  6. Severe aortic stenosis, status post bioprosthetic aortic valve      replacement in 1999.  The patient has a #19 bovine pericardial      prosthesis.  7. Coronary artery disease.  Patient with 60% right coronary artery      stenosis on a pre-aortic valve replacement bypass surgery.      Adenosine Myoview in September 2009 showed no perfusion defect.  8. History of breast cancer 10 years ago, status post lumpectomy and      radiation.  9. History of hypertension.  10.Hyperlipidemia.  11.History of methicillin-resistant Staphylococcus aureus bacteremia.  12.The patient is a Do Not Resuscitate/Do Not Intubate.   Discharge medications will be dictated per discharging physician.  Disposition and followup will be dictated per discharging physician.   CONSULTATIONS DONE:  1. Cardiology consult was done.  The patient was seen in consultation      by Dr. Shirlee Latch of [facility] on November 10, 2008.  2. Critical care/pulmonary  medicine consult was done.  The patient was      seen by Dr. [name] on November 10, 2008.  3. A palliative care consult was done.  The patient was seen in      consultation by Dr. Barbee Shropshire on November 14, 2008.   PROCEDURES PERFORMED:  1. A chest x-ray was done on November 10, 2008, that showed worsening      interstitial and air space pulmonary edema since yesterday and      enlarging bilateral pleural effusions, associated passive      atelectasis in the left lower lobe, baseline pulmonary fibrosis      suspected.  2. CT of the chest with contrast was done on November 10, 2008, that      showed dominant finding of severe and diffuse central lobular      emphysema.  Mild patchy airspace disease in the right upper lobe      could be due to early bronchopneumonia.  Scattered calcified      pulmonary nodules are likely due to old granulomatous disease.  A  0.6-cm subpleural nodule on the right.  If the patient is at high      risk for bronchogenic carcinoma, followup chest CT in 6-12 months      is recommended.  If the patient is low-risk, a followup CT 12      months is recommended.  3. A 2-D echo was done on November 10, 2008, that showed a vigorous left      ventricular systolic function, EF of 65-70%.  Aortic valve      prosthesis appears to be opening well.  Mean gradient through the      prosthesis is 18 mmHg.  Mildly thickened mitral valve leaflets,      moderate tricuspid valvular regurgitation, PA peak pressure was 39      mmHg.   BRIEF ADMISSION HISTORY AND PHYSICAL:  [name] is an 77-year-  old white female with a history of atrial fibrillation, severe COPD and  chronic diastolic heart failure, who was seen by PCP earlier in the week  and subsequently referred to the [facility] pulmonary doctor and seen on  November 09, 2008, with increasing hypoxia and dyspnea.  The patient had  been hospitalized 4 times this past year in [place] for multiple COPD  exacerbation as well as MRSA  bacteremia.  Per daughter, a lung mass was  found on CT of February 2010, which was found to be benign.  The  patient's daughter had been calling EMS 3 times during the week of  admission due to worsening shortness of breath, responding to increased  O2 requirements.  The patient also presents with a chronic productive  cough, occasional palpitations with a heart rate in the 170s on the day  of admission and positive orthopnea.  The patient denied any recent  fever, no chest pain, no abdominal pain, no nausea, no vomiting, no  diarrhea, no dysuria, no generalized weakness, no lower extremity edema.  When the patient was seen in the ED, the patient had improved  symptomatically in terms of her shortness of breath after being put on  IV diltiazem as well as being given some Lasix.   PHYSICAL EXAM ON ADMISSION:  A temperature of 98.7, blood pressure  134/84, pulse of 112, respiratory rate 24, saturating 79% on 4 liters  initially, up to 97% on 50% FIO2.  GENERAL:  The patient was an elderly white female, awake, alert, in no  apparent distress.  HEENT: Normocephalic, atraumatic.  Pupils equal, round and reactive to  light and accommodation.  Extraocular movements intact.  Oropharynx was  clear, no lesions, no exudates.  NECK:  Supple.  No lymphadenopathy.  RESPIRATORY:  Breath sounds were diminished throughout.  The patient did  have diffuse expiratory wheezes, some increased work of breathing and  some scattered rales in the left lower basilar lobes.  CARDIOVASCULAR:  Irregularly irregular with a 2/6 systolic ejection  murmur.  ABDOMEN:  Soft, nontender, nondistended, positive bowel sounds.  No  hepatosplenomegaly.  EXTREMITIES:  No clubbing, cyanosis or edema.  NEUROLOGIC:  The patient was alert and oriented x3.  Cranial nerves II-  XII were grossly intact.  No focal deficits.   ADMISSION LABS:  CBC:  White count 10, hemoglobin 11.0, platelets of  415, hematocrit 32.4, ANC of 8.7.   D-dimer was 0.97.  INR of 2.7.  Digoxin of 0.6.  Point of care cardiac markers were negative x1.  Chest  x-ray as stated above.  ABG with a pH of 7.53, pCO2 of 30  and a pO2 of  49.7.  A sodium of 139, potassium 3.5, chloride 102, bicarb 26, BUN 6,  creatinine 0.76, glucose of 130.   HOSPITAL COURSE:  1. Acute hypoxic respiratory failure.  The patient was admitted with      acute hypoxic respiratory failure which was felt to be      multifactorial in nature.  The patient was admitted to the step-      down unit.  It was felt the patient's acute hypoxic respiratory      failure was multifactorial in nature secondary to an acute on      chronic diastolic heart failure, probable early pneumonia, atrial      fibrillation and a COPD exacerbation.  The patient was placed on      strict I's and O's and daily weights.  Cardiac enzymes were cycled      x3, which came back negative.  The patient was placed on IV      diuretics of Lasix.  The patient was also placed on IV steroid and      subsequently underwent a steroid taper as well as oxygen, Mucinex      and nebulizer treatments.  She was also started on empiric therapy      of Avelox 400 mg IV daily and placed on beta blocker.  A critical      care/pulmonary consultation was done.  The patient was seen in      consultation by Dr. Maple Hudson on November 10, 2008.  A cardiology consult      was also done.  The patient was seen by Dr. Shirlee Latch on November 10, 2008.  A 2-D echo was also done with results as stated above.  The      patient started to improve clinically on a daily basis with a      decrease in O2 requirements with these treatments.  The patient      diuresed well and Lasix was held for a couple of days secondary to      increasing creatinine and clinical improvement in terms of acute on      chronic diastolic failure.  The patient was subsequently then      placed on a maintenance dose of Lasix at 20 mg daily.  The      patient's IV steroids  were tapered down and the patient was placed      on oral steroids, which she was tolerating and which will be      tapered on discharge.  In terms of the patient's atrial      fibrillation, the patient was subsequently placed on Zebeta for      rate control as well as digoxin for rate control and aspirin.      Cardiology was consulted.  The patient was seen in consultation by      Dr. Shirlee Latch and the patient was followed during the hospitalization.      The patient improved and as such was transferred from the step-down      unit to the telemetry unit, where the patient remained stable and      was at her baseline as of November 14, 2008, only requiring 2-3 liters      of oxygen per nasal cannula, which is her home dose of oxygenation.      The patient as of November 14, 2008, was in stable and improved      condition.  2. Acute on chronic diastolic heart failure.  The patient was noted to      be in acute on chronic diastolic heart failure on admission, which      was felt to be one of the etiologies of her acute respiratory      failure.  A BNP which was obtained was slightly elevated at 291.      The patient was placed on a strict I's and O's, daily weights and      diuresed with IV Lasix with good response.  Lasix was subsequently      held secondary to worsening renal function and clinical improvement      and subsequently restarted on maintenance dose of Lasix 20 mg      orally daily.  The patient improved and as of November 14, 2008, the      patient's acute on chronic diastolic heart failure had been well-      compensated.  3. Atrial fibrillation with rapid ventricular response.  The patient      was noted to be in AFib with RVR on admission.  Per family, the      patient had a heart rate in the 170s.  In the ED the patient's      heart rate was in the 130s to the 140s.  The patient was placed on      an IV diltiazem drip, which was subsequently switched to off and      the patient placed  on an oral beta blocker with good rate control.      Digoxin was also added to her regimen.  Cardiology was consulted.      The patient was seen in consultation by Dr. Shirlee Latch.  The patient      was rate-controlled with Zebeta and digoxin and placed on aspirin.      The patient will follow up with cardiology as an outpatient.  As of      November 14, 2008, the patient was in stable condition.  4. Pneumonia.  On admission, a chest CT was done secondary to an      increased D-dimer level with results as stated above.  It was noted      that the patient may have an early bronchogenic pneumonia ongoing      and subsequently placed on empiric Avelox for this.  Sputum      cultures were unable to be obtained secondary to patient inability      to produce a sputum.  The patient remained afebrile and the patient      improved symptomatically.  As of November 14, 2008, the patient was in      stable and improved condition and had been switched to oral Avelox.      The patient will complete a 1-week course of antibiotics for her      early bronchogenic pneumonia.  5. Chronic obstructive pulmonary disease exacerbation.  On admission      it was felt that the patient's acute hypoxic respiratory failure      was in part secondary to a COPD exacerbation.  The patient does      have a history of severe COPD that was on home O2.  The patient was      placed on oxygen as well as Mucinex and nebulizer treatments as      well as initially on IV Solu-Medrol, which was subsequently      converted to oral steroids.  The patient's wheezing improved and      the patient was no longer wheezing as of November 14, 2008.  The      patient's O2 requirement improved down to 2-3 liters nasal cannula.      Pulmonary was consulted.  The patient was seen in consultation by      Dr. Maple Hudson of [facility], who followed her during the      hospitalization.  The patient improved with a steroid taper, was      also on Avelox for the  presumed bronchogenic pneumonia.  The      patient will finish a 7-day course of antibiotics.  The patient      improved on a daily basis and as of November 14, 2008, the patient was      in stable and improved condition.  A palliative care consult was      obtained secondary to the patient's severe COPD exacerbation.  The      patient was seen in consultation by Dr. Barbee Shropshire on November 14, 2008,      and it was recommended that an addition of Ativan be made to the      patient's medications for as-needed anxiety-related shortness of      breath as well as the recommendation of Roxanol as well for as-      needed shortness of breath.  It was felt the patient was hospice-      eligible with an end-stage COPD, and the patient will be followed      as an outpatient by hospice when discharged.   The rest of the patient's chronic medical issues were stable throughout  the hospitalization.   Discharge medications, disposition and followup will be dictated per  discharging physician.   It was a pleasure taking care of [name].      Ramiro Harvest, MD  Electronically Signed     DT/MEDQ  D:  11/16/2008  T:  11/16/2008  Job:  [id number]   cc:   Duncan Dull, M.D.  Fax: [phone]   Marca Ancona, MD  [address]  [place] [address]   Rennis Chris. Maple Hudson, MD, FCCP, FACP  [facility]  [address]  [place]  [place] [address]

## 2010-10-01 NOTE — Consult Note (Signed)
[name], Jeanette NO.:  [id number]   MEDICAL RECORD NO.:  [id number]          PATIENT TYPE:  INP   LOCATION:  1233                         FACILITY:  [facility]   PHYSICIAN:  Marca Ancona, MD      DATE OF BIRTH:  [birth year]   DATE OF CONSULTATION:  DATE OF DISCHARGE:                                 CONSULTATION   PULMONOLOGIST:  Dr. [name].   HISTORY OF PRESENT ILLNESS:  This is an 77 year old with a history of  severe COPD, chronic atrial fibrillation and in diastolic heart failure  who presented to [facility] with atrial fibrillation and rapid  ventricular response as well as severe shortness of breath.  The patient  has recently been hospitalized 4 times in 2010 for COPD exacerbations in  [place], [place].  Once the patient actually had MRSA bacteremia.  Over the  last few weeks, the patient has been back in [place]. She has had  shortness of breath with minimal exertion over the last 3 days.  She has  been short of breath at rest.  She has not had any fever. She has been  worse in the morning.  Her daughters have called EMS for 3 mornings in a  row.  The first 2 mornings, she refused hospitalization stay; however,  she came in to the Emergency Department.  Yesterday, she saw Dr. Maple Hudson  who found her oxygen saturation was in the 80% range on 2 L nasal  canula.  He increased her oxygen to 4 L by nasal canula.  In the  Emergency Department today, the patient was found to be in atrial  fibrillation with a heart rate in the 120s-130s.  Per her daughter, it  was as high as the 170s this morning when they checked her heart rate.  The patient did have evidence of some pulmonary edema on chest x-ray.  Her BNP was mildly elevated.  The patient was given Lasix 40 mg IV and  was admitted to the hospital.  She did have a CT of her chest showing a  possible right upper lobe pneumonia.   PAST MEDICAL HISTORY:  1. Severe aortic stenosis status post  bio-prosthetic aortic valve      replacement in 1999.  She has a #19 bovine pericardial prosthesis.      Last echo was in September 2009, showed moderate LVH EF greater      than 55%, moderate left atrial enlargement and moderate mitral      regurgitation and a mean gradient across the aortic valve 12 mmHg.  2. Coronary artery disease.  The patient had 60% RCA stenosis on her      pre-aortic valve replacement bypass surgery.  Adenosine Myoview in      September 2009 showed no perfusion defect.  3. History of breast cancer.  This was 10 years ago.  The patient had      lumpectomy and radiation.  4. Atrial fibrillation.  The patient had onset of atrial fibrillation      in September 2009.  She has been on Coumadin and has been  rate      controlled.  5. Diastolic congestive heart failure.  6. Chronic kidney disease.  7. COPD.  This is apparently severe.  The patient has been on home      oxygen and has had multiple COPD exacerbations recently.  8. Hypertension.  9. Hyperlipidemia.  10.History of MRSA bacteremia.  11.DNR/DNI.   SOCIAL HISTORY:  The patient is widowed.  Her son and daughter all live  here in [place].  She is now staying with them.  She quit smoking in  2000.   FAMILY HISTORY:  Noncontributory.   CURRENT MEDICATIONS:  1. Aspirin 81 mg daily.  2. Digoxin 0.125 mg daily.  3. Diltiazem drip at 5 mg and hour.  4. Warfarin .  5. Lexapro.  6. Advair.  7. Lasix 40 mg IV b.i.d.  The patient has not been on Lasix at home.  8. Toprol XL 50 mg b.i.d.  9. Moxifloxacin 400 IV daily.  10.Niaspan extended release.   PHYSICAL EXAMINATION:  VITAL SIGNS:  Blood pressure 134/84, heart rate  initially, on arrival, was 112 in atrial fibrillation, it is down to 78  is in atrial fibrillation on diltiazem drip.  Temperature 98.7, oxygen  saturation 79% on 4 L nasal canula, now 97% on 50% face mask.  GENERAL:  This is a frail, elderly female in no apparent distress.  NEUROLOGIC::   Alert and oriented x3, normal affect.  NECK:  JVD is mildly elevated at 8-9 cm of water.  There is no  thyromegaly or thyroid nodule.  HEENT:  Normal.  LUNGS:  Decreased breath sounds bilaterally with no wheezes.  CARDIOVASCULAR:  Heart rate irregular, S1, S2.  There is a 2/6 systolic  crescendo, decrescendo, early to mid peaking systolic murmur at the  right upper sternal border.  There is no peripheral edema.  There are 1+  posterior tibial pulses bilaterally.  There is no carotid bruit.  ABDOMEN:  Soft, nontender, no hepatosplenomegaly, normal bowel sounds.  EXTREMITIES:  No clubbing or cyanosis.  SKIN:  Normal exam.  MUSCULOSKELETAL:  Normal exam.   LAB WORK:  BNP 291.  Digoxin 0.6.  Cardiac enzymes negative x2.  Potassium 3.5, creatinine 0.76.  INR 2.7.  LFTs normal.  Hematocrit  32.4, white count 10.   RADIOLOGY:  CT chest done today shows severe emphysema, possible right  upper lobe pneumonia.  Chest x-ray shows intracranial and air space  pulmonary edema.  There may be underlying pulmonary fibrosis.  The  patient has not yet had an EKG done.   ASSESSMENT/PLAN:  This is an 77 year old with history of severe chronic  obstructive pulmonary disease, atrial fibrillation, diastolic congestive  heart failure who presented with respiratory distress.  1. Pulmonary.  The patient presented with respiratory distress and      hypoxia in the setting of atrial fibrillation with rapid      ventricular response.  She has a history of chronic obstructive      pulmonary disease on home oxygen and frequency of exacerbations.      BNP is mildly elevated.  Chest x-ray shows a component of pulmonary      edema with a questionable background of pulmonary fibrosis.  She      may have right upper lobe pneumonia as well on CT read.  It appears      that the patient may have developed diastolic congestive heart      failure in the setting of atrial fibrillation with rapid  ventricular response  with a baseline of poor pulmonary reserve.  On      the other hand, I do wonder if she is having a chronic obstructive      pulmonary disease exacerbation/pneumonia with hypoxia driving the      atrial fibrillation.  The patient is not wheezing, however.  I      would agree with Lasix 40 mg IV b.i.d. to diurese her.  She has      some volume overload but not severe.  She may not need much IV      Lasix.  I would follow her creatinine closely, control her heart      rate well.  I would treat her possible pneumonia with moxifloxacin      as you are doing, follow blood cultures.  Given her underlying      chronic obstructive pulmonary disease, I wonder if steroids would      be helpful; however, she is not wheezing.  I will leave this up to      pulmonary.  2. Atrial fibrillation.  She is on Toprol XL and diltiazem drip with      good rate control.  We will transition to p.o. meds in the morning.      We will continue her Coumadin.  3. Bioprosthetic aortic valve.  Would do an echo to assess the valve.      She has had recent methicillin resistant Staph aureus bacteremia.      Make sure there is no obvious vegetation.      Marca Ancona, MD  Electronically Signed     DM/MEDQ  D:  11/10/2008  T:  11/10/2008  Job:  [id number]

## 2010-10-01 NOTE — Assessment & Plan Note (Signed)
[name] - [facility]                            CARDIOLOGY OFFICE NOTE   RAINN, ZUPKO                       MRN:          [id number]  DATE:03/07/2008                            DOB:          1916-11-07    PRIMARY CARE PHYSICIAN:  Dr. Willaim Sheng.   HISTORY OF PRESENT ILLNESS:  This is an 77 year old with multiple  medical problems including history of bovine aortic valve replacement,  breast cancer, and coronary artery disease who presents to Cardiology  Clinic for a followup evaluation of atrial fibrillation and diastolic  congestive heart failure.  Since the last appointment, the patient has  been doing a bit better.  She is somewhat less short of breath with  better rate control.  Her heart rate at rest today is 75.  She still is  short of breath after walking about 100 feet.  She states she can climb  a flight of stairs, but she is short of breath at the top.  She is  walking around in her daughter's house without any significant shortness  of breath.  She has had no chest pain.  In general, she is feeling  somewhat better.  She did have an adenosine Myoview at the end of last  month that showed no perfusion defect and additionally she stopped the  Levoxyl that she had been started on by her primary care physician.  She  thought maybe this was leading to some of the problems that she was  having.  Since she last saw me, the patient was seen by Neurology  because of an episode of speech difficulty.  The neurologist at [facility] did  not have any recommendation for further imaging.  He thought she was  doing okay.  She was also seen by Pulmonology, who started her on  Spiriva for her mild COPD.   MEDICATIONS CURRENTLY:  1. Digoxin 0.125 mg daily.  2. Coumadin.  3. Lexapro 10 mg daily.  4. Toprol-XL 50 mg b.i.d.  5. Niacin 500 mg daily.  6. Diltiazem CD 240 mg daily.  7. Spiriva daily.  The patient has stopped Lasix since last      appointment because  her creatinine rose to 2.   PAST MEDICAL HISTORY:  1. History of severe aortic stenosis status post aortic valve      replacement with a #19 bovine pericardial valve in 1999.  Most      recent echo was done in September 2009, and showed mild LVH, EF      greater than 55%, moderate left atrial enlargement.  There was      moderate mitral annular calcification.  There was no evidence of      mitral stenosis, but there was moderate mitral regurgitation.      Pulmonary artery systolic pressure was 36 mmHg.  There was a bovine      aortic valve replacement with a mean gradient of 12 mmHg that      seemed to be functioning normally.  There was mild diastolic      dysfunction without evidence of significantly elevated filling  pressures.  2. History of coronary artery disease.  The patient has had a 60% RCA      stenosis on her pre-aVR cath.  I am not sure if she had a bypass      graft done at the time of her aortic valve replacement.  She had an      adenosine Myoview in September 2009 that showed no perfusion defect      and it was a negative study.  3. History of breast cancer.  The patient had lumpectomy with      radiation, but no chemotherapy about 10 years ago.  4. Atrial fibrillation.  The patient had new-onset atrial fibrillation      in September 2009.  She is on Coumadin.  She is being rate      controlled, actually requiring digoxin, Toprol-XL, and diltiazem      CD.  5. Hypertension.  6. Mild emphysema.  7. Diastolic congestive heart failure.  8. Chronic kidney disease.  Baseline creatinine is about 1.3.  After      starting her Lasix, her creatinine rose to 2.0, and her Lasix has      been stopped.   SOCIAL HISTORY:  The patient is widowed.  She lives 6 months of the year  in [place] and 6 months of the year in [place].  She is here with her  daughter and son-in-law, they live in [place].  She used to smoke,  but quit 10 years ago.  Drinks occasional alcohol, not  heavily.  She  does have 3 children.   LABORATORY DATA:  Her most recent labs; potassium 5, BUN 75, creatinine  2, BNP 316, and digoxin 1.1.   PHYSICAL EXAMINATION:  VITAL SIGNS:  Blood pressure 115/66, heart rate  75 and irregular, weight is 151 pounds, this is stable compared to prior  appointment.  GENERAL:  This is an elderly female in no apparent distress.  NEUROLOGIC:  Alert and oriented x3.  There is normal affect.  NECK:  JVP is not elevated.  There is no thyromegaly or thyroid nodule.  HEART:  Irregular.  S1 and S2.  No S3, no S4.  There is a 2/6 systolic  crescendo-decrescendo murmur at the right upper external border.  There  is also a 2/6 murmur at the apex.  There is radiation of the murmur into  the neck bilaterally.  There is no peripheral edema.  There is no  carotid bruit.  LUNGS:  Clear to auscultation bilaterally with normal respiratory  effort.   ASSESSMENT AND PLAN:  This is an 77 year old with history of aortic  valve replacement, coronary artery disease, and emphysema, who presents  to Cardiology Clinic for evaluation of new-onset atrial fibrillation  with significant shortness of breath symptoms.  1. Atrial fibrillation.  The patient is better rate-controlled now, on      diltiazem, digoxin, and Toprol-XL.  Her heart rate is in the low-to-      mid 70s on my evaluation this morning at rest.  Her shortness of      breath is considerably better since last appointment; however, she      is still short of breath after walking about a 100 feet.  I think      that her symptoms are due to diastolic dysfunction in the setting      of atrial fibrillation.  I think it is best to anticoagulate and      rate control her.  In all  likelihood,  if we were to cardiovert      her, atrial fibrillation would probably recur in fairly short order      given her significantly enlarged left atrium and her moderate      mitral regurgitation.  We could put her on anti-arrhythmic       medicine; however, I do not think I would want to expose her to      potential toxicities unless we absolutely had to.  At this time, as      her symptoms have somewhat resolved with better rate control, we      will continue this strategy.  I do not like having to have her on 3      nodal-blocking agents; however, right now this is what is requiring      to keep her heart rate within reasonable limits.  2. Diastolic congestive heart failure.  This is likely exacerbated by      the patient's atrial fibrillation.  Her shortness of breath is      improved with better rate control.  She does appear euvolemic      today.  She was on Lasix in the past and her creatinine rose to 2.      We have stopped the Lasix.  We are going to recheck a chem-7 today      to make sure that her creatinine and her potassium are decreasing.      She looks euvolemic.  3. Bioprosthetic aortic valve.  The valve appears to be functioning      normally with normal gradient on echo done in September.  4. Coronary artery disease.  The patient did have an adenosine Myoview      done last month that showed no perfusion defects.  5. Question of hypothyroidism.  The patient was started on Levoxyl by      her primary care physician.  She did stop it on her own accord.  I      am going to go ahead and check her TSH and a free T4 to see where      that stands.     Marca Ancona, MD  Electronically Signed    DM/MedQ  DD: 03/07/2008  DT: 03/08/2008  Job #: [id number]   cc:   Dr. Peri Maris

## 2010-10-01 NOTE — H&P (Signed)
NAMESHAWNELLE, SPOERL              ACCOUNT NO.:  [id number]   MEDICAL RECORD NO.:  [id number]          PATIENT TYPE:  INP   LOCATION:  0101                         FACILITY:  [facility]   PHYSICIAN:  Beckey Rutter, MD  DATE OF BIRTH:  07/05/1916   DATE OF ADMISSION:  12/28/2008  DATE OF DISCHARGE:                              HISTORY & PHYSICAL   PRIMARY CARE PHYSICIAN:  Duncan Dull, M.D.   CHIEF COMPLAINT:  Weakness and tarry stools.   HISTORY OF PRESENT ILLNESS:  This is an 77 year old, very pleasant,  Caucasian female with past medical history significant for bovine aortic  valve replacement in 1999, breast cancer, status post chemotherapy and  radiation, atrial fibrillation and diastolic congestive heart failure,  coronary artery disease, hypertension, chronic kidney disease, pulmonary  hypertension,  depression, COPD, presented secondary to weakness.  The  condition started when she was outside the [place] in the family [place].  She was then progressively becoming weaker and weaker over the span of 3  days.  Yesterday the family had to take her had to carry her from bed to  chair to ambulate her.  Usually the baseline is able to ambulate with  walker as long as she has her oxygen attached to her during ambulation.  The patient had bulky, black tarry stool yesterday and the patient was  following up with hospice nurse who advised them to bring the patient to  the hospital secondary to the significant weakness and tarry stools.   In the emergency department, the patient was guaiac positive and her  hemoglobin was 5.9.  The patient denied any fever, cough, chills or  headaches.  The shortness of breath is usual to her as a baseline,  although the patient has a feeling of air hunger currently.  She denied  any pain including abdominal pain.   PAST MEDICAL HISTORY:  1. The patient on chronic Coumadin and she was on aspirin for aortic      valve replacement with a bovine valve in  1999.  2. History of atrial fibrillation with transient interventricular      response.  3. Diastolic heart failure.  4. Coronary artery disease.  5. Hypertension.  6. Chronic kidney disease with baseline 1.3.  7. Pulmonary hypertension with pulmonary artery systolic of 36 on echo      of September 2009.  8. Depression.  9. COPD.  The patient is on home oxygen.  10.History of breast lumpectomy in 2007.  11.History of partial thyroidectomy.   SOCIAL HISTORY:  Mrs. Tipps lives with her daughter at home.  She  denied tobacco abuse, ethanol abuse or drug abuse.   FAMILY HISTORY:  Family history is noncontributory.   ALLERGIES:  Not known to have medication allergy.   MEDICATIONS:  1. The patient on home oxygen 2-4 liters as needed.  2. Colace 100 mg daily.  3. Coumadin.  4. Crestor 40 mg daily.  5. Diazepam 0.5-1 mg p.r.n.  6. Albuterol/ipratropium 0.5/2.5 p.r.n.  7. Iron 325 mg daily.  8. Lanoxin 0.125 daily.  9. Lexapro 10 mg at bedtime.  10.Metoprolol  tartrate 25 mg b.i.d.  11.Mucinex maximum strength q.12 h.  12.Niaspan 500 mg at bedtime.  13.Oxycodone 5 mg p.r.n.  14.Prilosec 40 mg daily.  15.Senna 8.6 p.r.n.  16.Tessalon Perles 100 mg as needed.  17.Tylenol 650 mg as needed.   REVIEW OF SYSTEMS:  The patient denies fevers, chills, headache.  She  denied also abdominal pain.  The patient noticed some heart racing.  Otherwise, the rest of the system review is negative for examination.   PHYSICAL EXAMINATION:  VITAL SIGNS:  Her temperature was lying on 98.3,  blood pressure is 126.67, pulse 107, respiratory rate is 36.  HEENT:  Atraumatic, normocephalic.  PERRL.  The conjunctivae are pale.  Mouth is  dry with pale mucosa.  NECK:  Supple.  No JVD.  LUNGS:  Bilateral decreased air entry.  HEART:  Precordial heart sounds irregularly irregular.  ABDOMEN:  Soft, nontender.  Bowel sounds present.  EXTREMITIES:  No lower extremity edema.  NEUROLOGICALLY:  The patient felt  weak but seems orientated at least x2-  1/2.   LABORATORY DATA AND X-RAYS:  Her PT is 36.6, INR 3.7.  Microscopic urine  showing wbc's 0-2.  Urinalysis is yellow, nitrates negative, leukocyte  esterase negative.  Troponin is 0.06.  Sodium 136, potassium 3.7,  chloride 97, CO2 29, glucose 115, BUN 30, creatinine 1.39.  CBC is  showing white blood count is 15.2, hemoglobin 5.9, hematocrit 17.6,  platelets 318,000.   ASSESSMENT AND PLAN:  This is an 77 year old with severe anemia  secondary to gastrointestinal bleeding.  The patient was taking Coumadin  for atrial fibrillation and valve replacement as discussed above.  She  was taking aspirin as of yesterday.  It was stopped by the hospice  nurse.   PLAN:  The patient will be admitted to step-down for further assessment  and management.  1. Anemia.  Will send an anemia profile and will transfuse 2 units of      packed RBCs.  The patient will have  Protonix intravenously every      12 hours with stat dose. Will call gastroenterology for evaluation      in the morning.  2. Atrial fibrillation.  The patient has tachycardia now with heart      rate more than 100.  The patient started taking beta blocker.  If      the heart rate is sustained above 100, will start the patient on      Cardizem drip.  The patient had history of porphyria stenosis,      status post bioprosthetic aortic valve replacement in 1999.  The      patient was taking Coumadin with INR 3.7.  I will skip the Coumadin      dose tonight and reevaluate after the blood transfusion.  I will      check the digoxin level.  3. The patient's Prilosec was discontinued and she will be started on      intravenous Protonix.  4. COPD.  The patient's saturation is stable.  Continue the patient on      2-3 liters of nasal oxygen.  5. Deep venous thrombosis prophylaxis.  The patient does not need      prophylaxis now as INR is 3.7 as discussed above.  6. Gastrointestinal prophylaxis.   The patient will be started on IV      Protonix as discussed above.      Beckey Rutter, MD  Electronically Signed     EME/MEDQ  D:  12/28/2008  T:  12/29/2008  Job:  [id number]   cc:   Duncan Dull, M.D.  Fax: [phone]

## 2010-10-01 NOTE — Consult Note (Signed)
NAMEJEVON, [name]              ACCOUNT NO.:  [id number]   MEDICAL RECORD NO.:  [id number]          PATIENT TYPE:  INP   LOCATION:  0104                         FACILITY:  [facility]   PHYSICIAN:  Clinton D. Maple Hudson, MD, FCCP, FACPDATE OF BIRTH:  04/02/1919   DATE OF CONSULTATION:  11/10/2008  DATE OF DISCHARGE:                                 CONSULTATION   DISCHARGE DATE:  Pending.   PULMONOLOGIST:  Dr. [name].   PRIMARY CARE PHYSICIAN:  Dr. Shaune Pollack.   CONSULTATIONS:  [facility], Dr. Ramiro Harvest.   CHIEF COMPLAINT:  Hypoxic respiratory failure.   HISTORY OF PRESENT ILLNESS:  [name] is an 77 year old white  female who was seen in the [facility] Pulmonary office on November 09, 2008 by  Dr. Maple Hudson for evaluation of dyspnea.  [name] has a complicated  medical path to follow as she spends 6 months of the year in [place] and  6 months of the year in [place].  She was recently evaluated by  Dr. Hilda Lias in [place], [place] for her pulmonary problems, and  was diagnosed with COPD.  Also at that time, she was noted to have  atrial flutter with RVR, and was thought that her heart conditions were  significantly contributing to her respiratory symptoms.  She had  followed up with Dr. Juanito Doom for her atrial fibrillation.  In the  office yesterday, it was unclear if any medical change since discharge  from rehab facility.  The plan was to follow up, continue home oxygen,  and obtain CBC and overnight oximetry.  She was instructed to increase  her oxygen to 3L at night and 3L during the day.  She went home from  pulmonary office and reportedly napped, and was unable to sleep last  p.m.  Around 12 midnight, she noted that she was experiencing  palpitations and shortness of breath.  She remained awake until  approximately 4 a.m. when she called her daughter to come upstairs for a  breathing treatment.  Per report, she gave her a DuoNeb and 5 mg of  valium without relief, and subsequently called EMS.  Upon EMS arrival,  it was noted that [name] was in atrial fibrillation with rapid  ventricular response and oxygen saturation of 71% according to daughter.  According to the family, the patient has been hospitalized 4 times this  year in [place] for breathing difficulties and MRSA sepsis.  In February  of 2010, a CT of the chest revealed lung mass and was found to be benign  according to primary notes.  This patient will be admitted to [facility] and pulmonary consulting for chronic hypoxic  respiratory failure.   PAST MEDICAL/SURGICAL HISTORY:  1. Bovine aortic valve replacement in 1999.  2. Breast cancer status post chemotherapy and radiation therapy.  3. Atrial fibrillation with transient rapid ventricular response.  4. Diastolic congestive heart failure.  5. Coronary artery disease.  6. Hypertension.  7. Chronic kidney disease with a baseline creatinine of 1.3 in October      2009.  8. Secondary pulmonary  hypertension with a PA systolic of 36 on echo      in September 2009.  9. Depression.  10.COPD.  11.Rhinoplasty.  12.Partial thyroidectomy.  13.Breast lumpectomy in 2007.   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  Spends 6 months of the year in [place] and 6 months of the year in  [place].  She is widowed.  She reports being a 1 pack per day smoker for  57 years and quit in 1999.  She reports occasional alcohol use, but  denies drugs.   FAMILY HISTORY:  Reports her mother is deceased.  Had thyroid  dysfunction and stroke.  Her father is deceased as well and only health  problems noted were obesity.  She reports having 1 brother, 1 sister,  and a twin sister.  The twin sister is still living and also has breast  cancer.  Her brother and sister were deceased in their early 27s of  cancer.   HOME MEDICATIONS:  1. Toprol XL 50 mg p.o. daily.  2. Crestor 40 mg p.o. daily.   3. Lexapro 10 mg p.o. daily.  4. Niacin 500 mg p.o. nightly.  5. Colace 100 mg p.o. b.i.d.  6. Tessalon Perles 100 mg t.i.d.  7. DuoNeb q.6 hours p.r.n. wheezing.  8. Lanoxin 0.125 mg daily.  9. Restoril 7.5 mg p.o. nightly p.r.n.  10.Advair 250/50 one puff b.i.d.  11.Tiazac 240 mg 1 p.o. daily.  12.Oxygen increased to 3-4L during the day as needed.  13.Valium 5 mg p.o. nightly p.r.n.   REVIEW OF SYSTEMS:  GENERAL:  Denies fevers or chills.  No weight  changes.  HEENT:  Denies headaches, voice changes, vision or hearing  loss.  Reports clear nasal discharge and nasal stuffiness.  SKIN:  Denies rash or lesion.  CARDIOVASCULAR/PULMONARY:  Reports shortness of  breath.  Dyspnea on exertion.  Is able to walk approximately 50-100  feet.  Also reports productive cough and wheezing.  As per HPI.  She  experienced palpitations and dyspnea last evening.  Denies hemoptysis,  edema, chest pain, or syncope.  Of note, the patient reports sleeping on  3 pillows at night.  GENITOURINARY:  Denies frequency, urgency, dysuria,  or hematuria.  GASTROINTESTINAL:  Denies nausea, vomiting, or diarrhea,  bowel pattern changes, and melena.  Also denies abdominal pain.  All  other systems reviewed and are negative.   PHYSICAL EXAM:  VITAL SIGNS:  On admission, heart rate noted 130 beats  per minute.  At noon, temperature 98.1, blood pressure 130/64, heart  rate 76.  Respirations 22.  Oxygen saturation 97% on 50% venturi mask.  GENERAL:  This is a chronically ill-appearing adult female in no acute  distress.  NEURO:  Awake, alert, and oriented, cranial nerves 2-12 grossly intact.  Strength 5/5 in all extremities.  Speech is clear.  HEENT:  Normocephalic, atraumatic.  Pupils are equal, round, and  reactive.  EOMs intact.  Sclerae clear.  Mucous membranes are pink and  dry.  NECK:  Supple without lymphadenopathy or thyromegaly.  No carotid bruits  auscultated.  No JVD noted.  CARDIOVASCULAR:  S1, S2.   Irregularly irregular rhythm with no murmurs,  rubs, or gallops.  Radial pulses +2 and equal bilaterally.  PULMONARY:  Respirations even and unlabored on 50% venturi mask.  Lungs  bilaterally are with expiratory wheezing.  GASTROINTESTINAL/GENITOURINARY:  Abdomen is round and soft, nontender to  palpation.  Bowel sounds x4 are active.  The patient is  able to void.  MUSCULOSKELETAL:  Moves all extremities.  No obvious joint deformities  noted.  SKIN:  No rashes or lesions noted.   RADIOLOGIC DATA:  June 25 x-ray comparison to office June 24 x-ray  reveals worsening interstitial and airspace opacities.  A June 25 CT of  the chest reveals diffuse emphysema with scattered opacified nodules and  small bilateral pleural effusions.   LABORATORY DATA:  ABG reveals a pH 7.529, PO2 of 49.7.  BMP with sodium  139, potassium 3.5, chloride 102, CO2 26, BUN 6, creatinine 0.76,  glucose 130.  CBC with WBC of 10, platelet count 415,000, hemoglobin 11,  hematocrit 32.4.   ASSESSMENT AND PLAN:  1. The patient will be admitted to medical telemetry floor per [facility].  She will continue on oxygen therapy to titrate for      saturations greater than 92%.  Will continue her home medications      of Advair, and place her on Xopenex and Atrovent.  Will attempt to      obtain sputum cultures and will be placed on Avelox 400 mg IV      daily.  Serial chest x-ray will be followed during admission.  2. Acute on chronic diastolic heart failure with associated atrial      fibrillation and RVR.  The patient has been placed on a Cardizem      drip per primary service.  Also noted cardiology has been      consulted.  I feel that atrial fibrillation with RVR had      significant impact on respiratory status prior to admission.  3. Best practice:  The patient will be placed on Protonix for stress      ulcer prevention and subcutaneous heparin for deep vein thrombosis      prophylaxis.      Canary Brim, NP      Clinton D. Maple Hudson, MD, Tonny Bollman, FACP  Electronically Signed    BO/MEDQ  D:  11/10/2008  T:  11/10/2008  Job:  [id number]

## 2010-10-01 NOTE — Consult Note (Signed)
[name], [name]              ACCOUNT NO.:  [id number]   MEDICAL RECORD NO.:  [id number]          PATIENT TYPE:  INP   LOCATION:  1430                         FACILITY:  [facility]   PHYSICIAN:  Olene Craven, M.D.DATE OF BIRTH:  10-Apr-1917   DATE OF CONSULTATION:  11/14/2008  DATE OF DISCHARGE:                                 CONSULTATION   REQUESTING PHYSICIAN:  Ramiro Harvest, M.D., of [facility].   REASON FOR CONSULTATION:  Recommendations on symptom management for  shortness of breath assist with goals of care.   PROBLEM LIST:  1. Severe chronic obstructive pulmonary disease with multiple      admissions over the last year, O2 dependent with minimal      cardiopulmonary reserve.  2. Atrial fibrillation with a history of rapid ventricular response      and congestive heart failure on Coumadin therapy.  3. Aortic stenosis status post valve replacement in 1999.  4. Coronary artery disease.  5. History of breast cancer status post lumpectomy and radiation.  6. Hypertension.  7. History of congestive heart failure, diastolic dysfunction.   RECOMMENDATIONS:  1. Shortness of breath related to COPD.  Recommendation at this time      the addition of Ativan 0.5 mg p.o. q.6 h. p.r.n. for anxiety-      related shortness of breath.  Recommend addition of Roxanol 20 mg      per mL 0.25-0.5 p.o. q.2 h. p.r.n. for shortness of breath.  Would      continue other pulmonary toilet and medications.  2. The patient is hospice eligible with a diagnosis of end-stage COPD      with a PPS of  40%.  Family wants to start services upon discharge.      This was discussed with Dr. [name], her pulmonologist, who was      in agreement with the diagnosis and prognosis.  3. Code status was discussed with the patient and her daughter, [name].  The patient appears to want a full code status at this      time with the intention that if it is obvious that she is not going  to recover that withdrawal from life support be done at that time.      This can best be summarized with the MOST form which we will      recommend filling out upon admission to hospice services.  Of      course, her hospital stay, she remains a full code at this time.      Recommendations were discussed with attending, Dr. [name].   HISTORY OF PRESENT ILLNESS:  This is an 77 year old female admitted to  [facility] for atrial fibrillation with rapid ventricular  response with a mild CHF exacerbation and questionable right upper lobe  pneumonia.  The patient has responded well to treatment with improvement  of her baseline rate without conversion to normal sinus rhythm.  During  hospitalization, it was noted that this was her fifth admission to the  hospital last year for previous admissions for COPD  exacerbation.  It  was felt by the admitting team that she was a good candidate to be  evaluated for a hospice services due to her increasing shortness of  breath.  There was some debate whether not her COPD led to the atrial  fibrillation with rapid ventricular response or the above comorbid  conditions.  At this time, the patient is in bed.  She is mildly short  of breath with minimal conversation wearing O2.  She is home O2  dependent with noted desaturations into 80% range while on O2.  Currently, rate is much improved.  She is finishing out a course of  antibiotic therapy for possible right upper lobe pneumonia.  Currently,  the patient is in bed resting comfortably, does not complain of chest  pain.  She does complain of baseline shortness of breath exacerbated  with minimal movement.  Denies any cough or fever or chills at this  time.   REVIEW OF SYSTEMS:  Positive for shortness of breath.  Positive for  palpitations.  Negative for chest pain.  Negative for abdominal pain.  Negative for fever or chills.  Negative for nausea, vomiting.  Negative  for lower extremity  edema.   PAST MEDICAL HISTORY:  Significant for above.  See problem list.   SOCIAL HISTORY:  She is a widow with 3 children, 2 daughters and a son.  She is living now in [place] with her culture.  Primary caretaker  [name].   FAMILY HISTORY:  Noncontributory.   She has no known drug allergies.   Her medication list was reviewed from the hospital.  Please see MAR.   EXAMINATION:  Today, her vitals were temperature 98.0, blood pressure  129/58, pulse anywhere from 60-72, saturating 99% on 2 liters.  GENERAL:  Age appropriate in no apparent distress.  HEENT:  Was grossly within normal limits.  Pupils equal and reactive to  light.  Extraocular motion intact.  Supple neck.  No JVD was noted.  Oropharynx was clear without erythema or exudate.  Mild thyromegaly.  No  lymphadenopathy.  LUNGS:  Diminished breath sounds throughout, slight expiratory wheeze.  HEART:  Was irregular irregular without murmur, gallop or rub.  ABDOMEN:  Soft, nontender, nondistended.  Positive bowel sounds.  BREAST EXAMINATION:  Was deferred.  GENITOURINARY AND GASTROINTESTINAL:  Was deferred.  NEURO:  The patient was oriented to person, place and time.  EXTREMITIES:  The patient had no clubbing, cyanosis or edema noted.   Her laboratory reports were reviewed.  Her CT scan and x-rays were  reviewed.   IMPRESSION:  This is an 77 year old female with multiple comorbid  conditions making her eligible for hospice services.  She will be  admitted under the hospice services upon discharge with the diagnosis of  COPD.  She has a PPS of approximately 40%.  Other comorbid conditions  include atrial fibrillation with a history of rapid ventricular response  now controlled, a history of CHF with diastolic dysfunction.  Family is  interested in starting hospice services.  Order will be placed to start  upon discharge.  For recommendations for her ongoing shortness of breath  related to her COPD, would  recommend the addition of the Ativan 0.5 mg  p.o. q.6 h. p.r.n. for anxiety related to shortness of breath.  Would  also recommend starting Roxanol 20 mg per mL 0.25-0.5 p.o. q.2 h. p.r.n.  for shortness of breath.  Ninety minutes was spent in evaluation,  examination and counseling and coordination of  care.  Greater than 50%  of time was spent in counseling and coordination of care in discussing  hospice eligibility, prognosis, and code status options with patient and  family. For any further questions, feel free to contact the palliative  care services.     Olene Craven, M.D.  Electronically Signed    DEH/MEDQ  D:  11/14/2008  T:  11/14/2008  Job:  [id number]

## 2010-10-01 NOTE — Consult Note (Signed)
[name], [name]              ACCOUNT NO.:  [id number]   MEDICAL RECORD NO.:  [id number]          PATIENT TYPE:  INP   LOCATION:  1401                         FACILITY:  [facility]   PHYSICIAN:  Wilson Singer, M.D.DATE OF BIRTH:  Jan 12, 1917   DATE OF CONSULTATION:  01/02/2009  DATE OF DISCHARGE:                                 CONSULTATION   REFERRING PHYSICIAN:  Corinna L. Lendell Caprice, MD   REASON FOR CONSULTATION:  To assess patient's symptoms of dyspnea and  weakness and establish goals of care.   PROBLEM LIST:  1. Dyspnea.      a.     Severe/end-stage/oxygen-dependent chronic obstructive       pulmonary disease.      b.     Anemia with recent gastrointestinal bleed.  2. Atrial fibrillation with rapid ventricular response.  The patient      is not a candidate for anticoagulation in view of recent      gastrointestinal bleeding.  3. Recent gastrointestinal bleed with a hemoglobin of 5.9, status post      blood transfusion, now hemoglobin in the 10 range.  The patient      does not want any further investigation of this.  4. History of diastolic congestive heart failure.  5. Pulmonary hypertension.  6. Systemic hypertension.  7. Palliative performance score 40%.   RECOMMENDATIONS:  1. DNR.  2. Scheduled small dose of short-acting opioid for dyspnea.  3. PT and OT evaluation to assess function and start to mobilize the      patient.  4. Move to the Palliative Care Unit.  5. Disposition - home with hospice when medically stable.   HISTORY:  This 76 year old very pleasant [place] lady was admitted to  the hospital approximately 5 days ago with weakness.  She was found to  have a hemoglobin of 5.9 and had a history of black tarry stools.  The  patient was given a blood transfusion and her hemoglobin is in the 10  range now.  She has not had any further GI bleeding.  The patient was  seen by prior attending physicians and she does not want any further  investigations of the  GI bleed.  Also during hospitalization, she has  had issues with atrial fibrillation and she has been seen by cardiology.  Her baseline appears to be shortness of breath on minimal exertion at  home and she is oxygen dependent.  She is under hospice services for her  diagnosis of COPD.  She has been under hospice services since June of  this year.  She tells me that she gets dyspneic on very minimal exertion  and this is resulting in frequent episodes of dyspnea.  She has  difficulty sleeping because of her dyspnea.   PAST MEDICAL HISTORY:  1. History of aortic valve replacement with a bovine aortic valve in      1999 for severe aortic stenosis.  She has been on chronic Coumadin      therapy since this time.  2. History of atrial fibrillation.  3. Diastolic congestive heart failure history.  4. Coronary  artery disease, stable.  5. Hypertension.  6. Chronic kidney disease with baseline creatinine of 1.3.  7. Pulmonary hypertension and pulmonary artery systolic pressure of 36      on echocardiogram in September 2009.  8. Depression.  9. Oxygen-dependent chronic obstructive pulmonary disease as mentioned      above.  10.Breast cancer with breast lumpectomy in 2007 with no history of      recurrence.  11.History of partial thyroidectomy.   SOCIAL HISTORY:  The patient is widowed and lives with her daughter at  home.  She does not smoke cigarettes and has not smoked for 10 years.  Prior to that, she had approximately 50 years smoking history of one  packet of cigarettes per day.  There is no history of alcohol abuse.   FAMILY HISTORY:  Noncontributory.   ALLERGIES:  No known drug allergies.   MEDICATIONS:  Medication administration record reviewed.   REVIEW OF SYSTEMS:  Apart from the dyspnea at the moment, she feels  better than when she had come in and her strength is improved.   PHYSICAL EXAMINATION:  The patient is afebrile and appears to be  hemodynamically stable.  There is  no clubbing.  There is no peripheral  central cyanosis.  HEART:  Heart sounds are present and in atrial fibrillation with a  ventricular rate currently at approximately 100-120 per minute.  She  does not appear to be in congestive heart failure clinically.  Her  jugular venous pressure is not raised.  LUNGS:  Lung fields show reduced air entry bilaterally with no evidence  of wheezing.  ABDOMEN:  Soft and nontender with no hepatosplenomegaly.  NEUROLOGICAL:  She is currently alert and oriented without any focal  neurologic signs.   IMPRESSION:  This 77 year old lady has end-stage chronic obstructive  pulmonary disease.  She has had a recent gastrointestinal  bleed which  has brought her into the hospital.  She seems to have improved from this  and the current issues seem to be management of her atrial fibrillation  with rapid ventricular response.  Dr. Gala Romney, cardiologist, has been  to see her today and has initiated Cardizem to control her ventricular  rate.  I think scheduled short-acting opioids would be very useful in  this situation and she is agreeable to the goals of care discussion as  above and further recommendations.  Time spent was 90 minutes, more than  50% of which were involved in counseling, coordination of care, and  discussion of pathophysiology of disease processes involved in her end-  stage chronic obstructive pulmonary disease .      Wilson Singer, M.D.  Electronically Signed     NCG/MEDQ  D:  01/02/2009  T:  01/02/2009  Job:  [id number]

## 2010-10-01 NOTE — Discharge Summary (Signed)
[name], [name]              ACCOUNT NO.:  [id number]   MEDICAL RECORD NO.:  [id number]          PATIENT TYPE:  INP   LOCATION:  1329                         [facility]ACILITY:  [facility]   PHYSICIAN:  Hollice Espy, M.D.DATE OF BIRTH:  1916/12/18   DATE OF ADMISSION:  12/28/2008  DATE OF DISCHARGE:                               DISCHARGE SUMMARY   PCP:  Dr. Shaune Pollack.   CONSULTANTS ON THIS CASE:  1. Dr. Lilly Cove.  2. Hospice and Palliative Care Medicine.  3. Dr. Gwenlyn Perking, Cardiology.   DISCHARGE DIAGNOSES:  1. Gastrointestinal bleed.  2. Status post 3 units packed red blood cell transfusion.  3. End-stage chronic obstructive pulmonary disease.  4. Atrial fibrillation with rapid ventricular response.  5. History of diastolic congestive heart failure.  6. Pulmonary hypertension.  7. Systemic hypertension.  8. History of aortic valve replacement with bovine aortic valve for      severe aortic stenosis.  9. History of coronary artery disease.  10.History of chronic renal disease with baseline creatinine of 1.3.  11.Reported history of transient ischemic attacks.  12.History of depression.  13.O2 dependence.  14.History of breast cancer with breast lumpectomy.  15.History of hypertension.  16.Sleep disturbance.   DISCHARGE MEDICATIONS:  The patient will be discontinuing her aspirin  medication.  She will be discontinuing her Coumadin medication.   Regular medicines will be:   1. Colace 100 daily.  2. Crestor 40 p.o. daily.  3. Valium 2.5 to 5 mg p.o. q.h.s. p.r.n.  4. DuoNeb every 6 hours.  5. Iron 325 daily.  6. Digoxin 0.125 p.o. daily.  7. Lexapro 10 p.o. daily.  8. Metoprolol 25 p.o. daily.  9. Mucinex 1200 p.o. b.i.d.  10.Niaspan 500 q.h.s.  11.Oxycodone HCl 5 mg 1-2 tabs p.o. q.4 hours and also to be taken at      night p.r.n.  12.Tylenol 650 one p.o. q.6 hours p.r.n.  13.Tessalon Perles 100 mg p.o. q.8 hours p.r.n. for cough.  14.Senokot 1-2 tabs  p.o. daily p.r.n. for constipation.  15.Prilosec 40 p.o. daily.  16.Oxygen 2-4 liters continuous.   HOSPITAL COURSE:  1. The patient is an 77 year old white female with past medical      history of end-stage COPD and diastolic congestive heart failure as      well as aortic valve replacement in 1999 on chronic Coumadin      therapy who presented with several days of black tarry stools.  She      has felt very weak.  When she came in, she was found to have a      hemoglobin of 5.9.  The patient was given a blood transfusion, and      her hemoglobin improved.  She was already on hospice care living at      home with family because of her end-stage COPD, and the patient      indicated she does not want extensive evaluation for her GI bleed.      Her Coumadin and aspirin were put on hold.  Dr. Dietrich Pates on behalf      of  Dr. Marca Ancona, the patient's cardiologist, saw the patient      and evaluated her.  Dr. Dietrich Pates felt that due to the patient's      severe GI bleed, her end-stage COPD, and being on hospice care, he      felt that it would be catastrophic to resume her aspirin or      Coumadin.  Felt that it would be appropriate to discontinue these      medications altogether.  The patient and her family were amenable      to this plan, and in the meantime keeping consistent with goals of      care Dr. Karilyn Cota from [facility] and Palliative Care Medicine was      consulted.  He used some short-acting opiates to help with the      patient's pain.  Roxanol was attempted, and the patient was      transferred to the Palliative Care Unit at [facility].      Over the next several days, patient had some improvement.  The only      complication was soon after she presented to the emergency room,      she was found to be in rapid atrial fibrillation secondary to      volume blood loss.  She was initially put on IV Cardizem, and      following fluid resuscitation and blood transfusion she  remained      stable going in and out of an atrial fibrillation.  As per her      goals of care and plan with the patient, she did not want any      aggressive work and continued on Cardizem.  2. Otherwise, patient did well.  The  only other recurrent was some      sleep disturbance.  She had problems with some mild hospital      psychosis and difficulty sleeping, sleeping much during the day and      not during most of the night.  Family made more arrangements with      hospice and working with home physical therapy, and  the patient      was felt to be stable and able to be transferred back to home with      some mobility as of January 09, 2009.  Family was able to get      additional support at home.   DISPOSITION:  The patient's overall disposition from initial  presentation is improved.   ACTIVITY:  Will be as tolerated with walker and full assistance home  health, PT and OT will see the patient at home.   DISCHARGE DIET:  Will be a heart-healthy diet.  Advised to avoid  caffeine, and the medication changes again of Coumadin and aspirin are  being discontinued.   The patient will see her PCP, Dr. Maurice Small, in the next 1-2 weeks  after getting home as well as Dr.  Marca Ancona in the next 2-4 weeks.      Hollice Espy, M.D.  Electronically Signed     SKK/MEDQ  D:  01/09/2009  T:  01/09/2009  Job:  [id number]   cc:   Gerrit Friends. Dietrich Pates, MD, [facility]  [address]  [place], [place] [address]   Shirlee Latch, M.D.   Karilyn Cota, M.D.   Kevan Ny, M.D.

## 2011-06-13 IMAGING — CT CT CHEST W/ CM
2 of 3 series · 15 of 36 positions shown, 18 images · IV contrast (agent unspecified)
Comparison: Plain films 11/09/2008 and 11/10/2008.

CLINICAL DATA: Cough, shortness of breath, respiratory distress

CT CHEST WITH CONTRAST
TECHNIQUE: Multidetector CT imaging of the chest was performed
following the standard protocol during bolus administration of
intravenous contrast.
Contrast: 8 ml [id number].

[Series 2: chest with st · axial · 0.69mm/px · z∈[-255,+0]mm · 12 of 61 slices shown, 15 images]
[im 5/61  mediastinal]
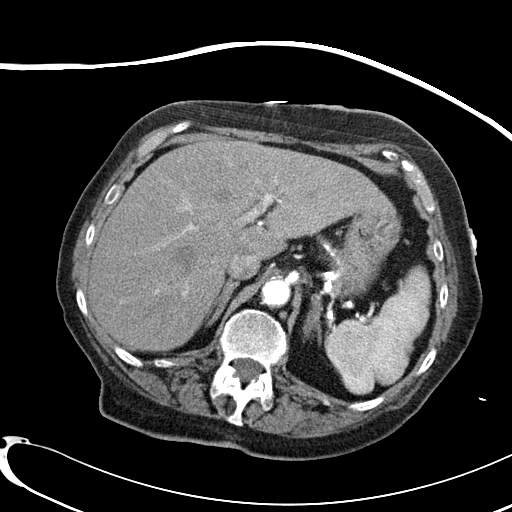
[im 5/61  lung]
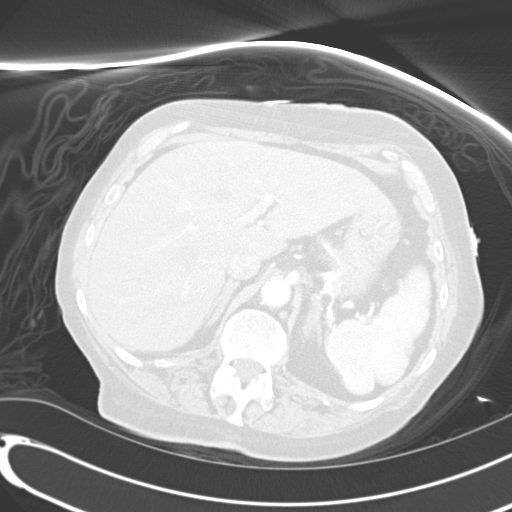
[im 9/61  lung]
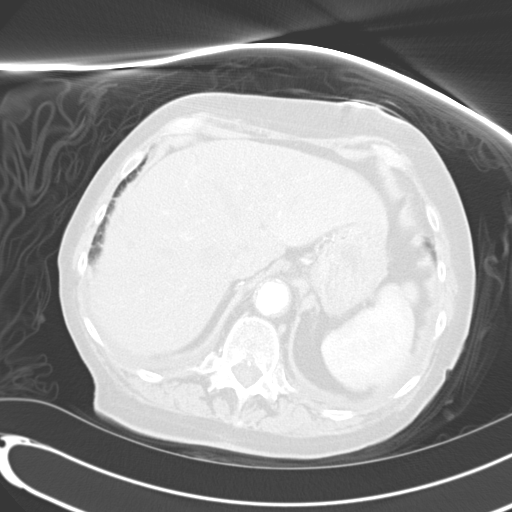
[im 14/61  lung]
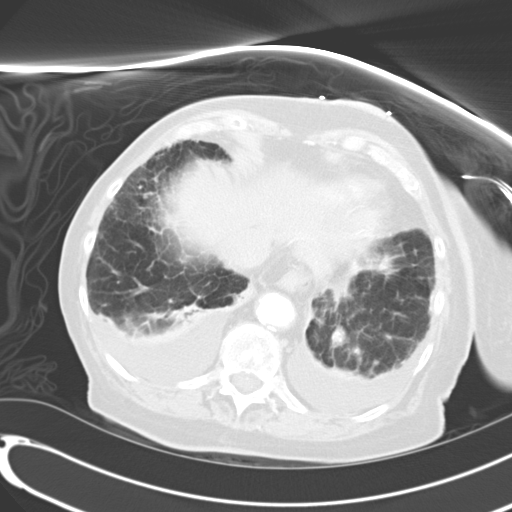
[im 18/61  lung]
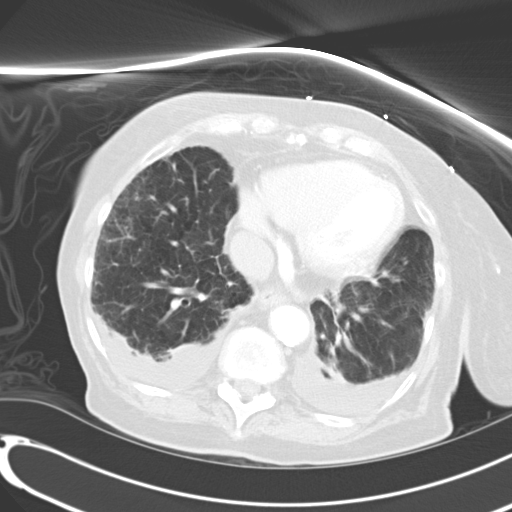
[im 23/61  mediastinal]
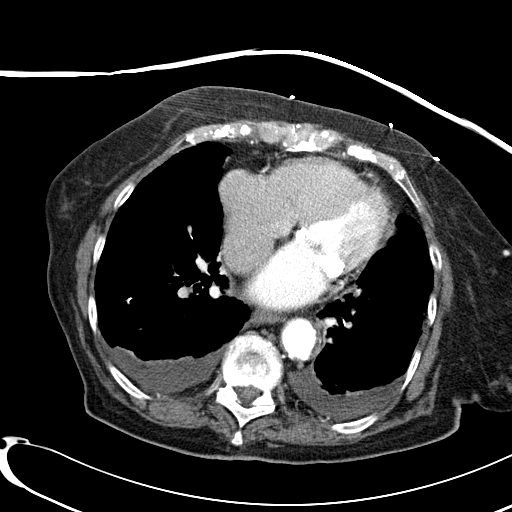
[im 23/61  lung]
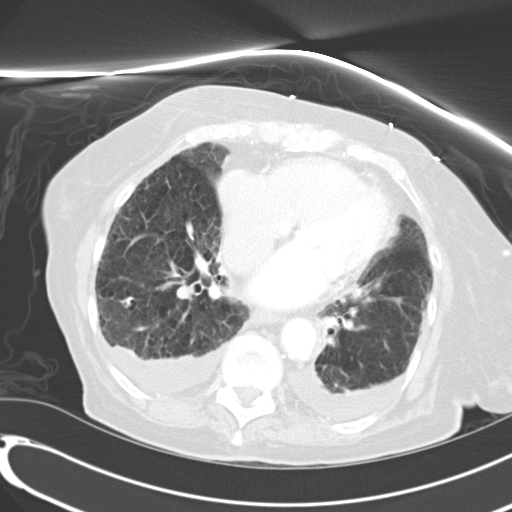
[im 27/61  lung]
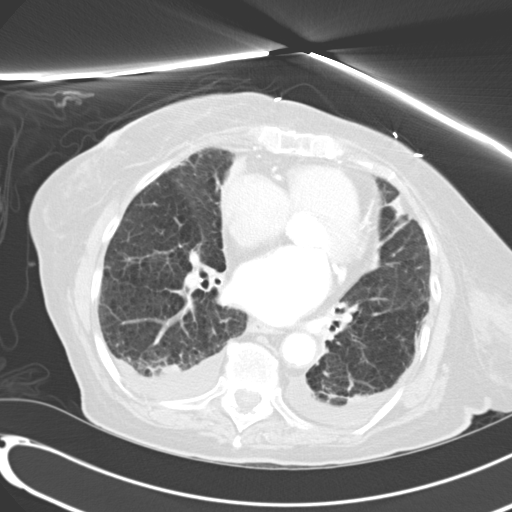
[im 34/61  lung]
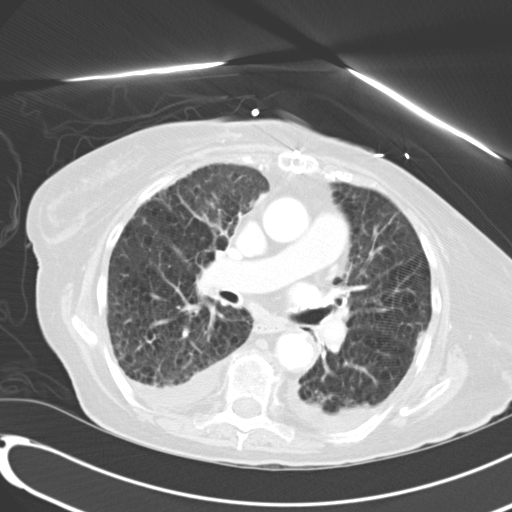
[im 38/61  lung]
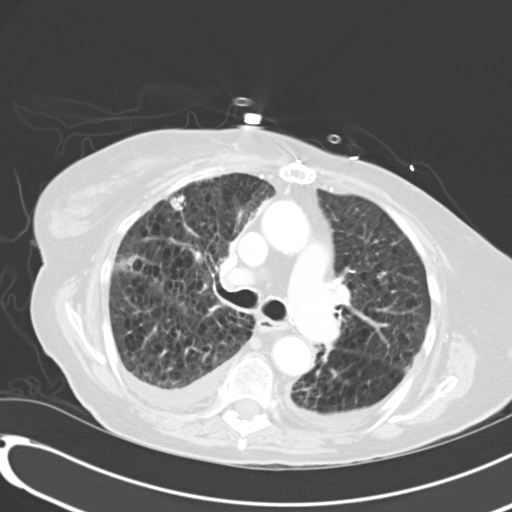
[im 43/61  mediastinal]
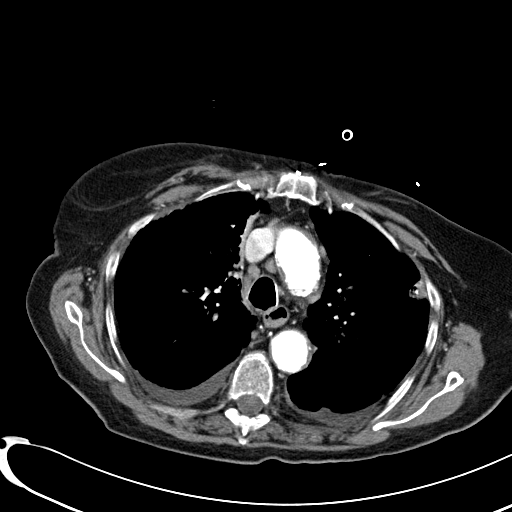
[im 43/61  lung]
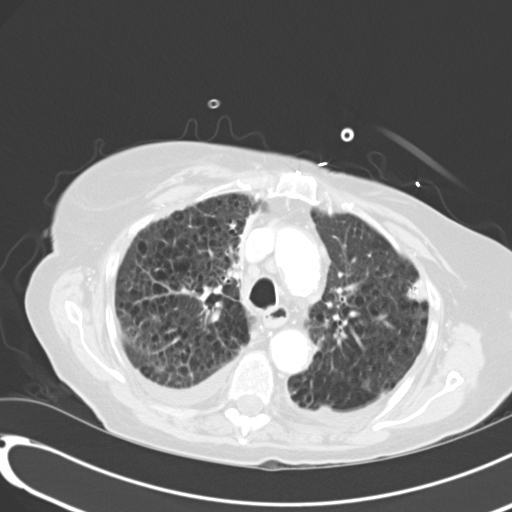
[im 47/61  lung]
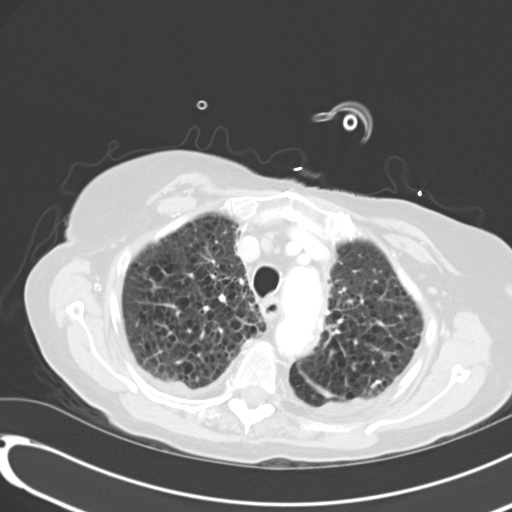
[im 52/61  lung]
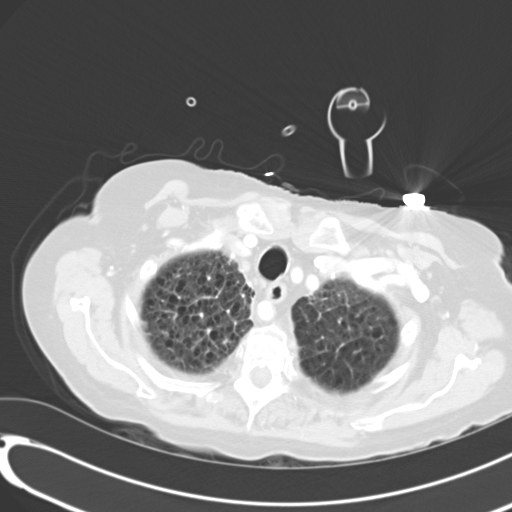
[im 56/61  lung]
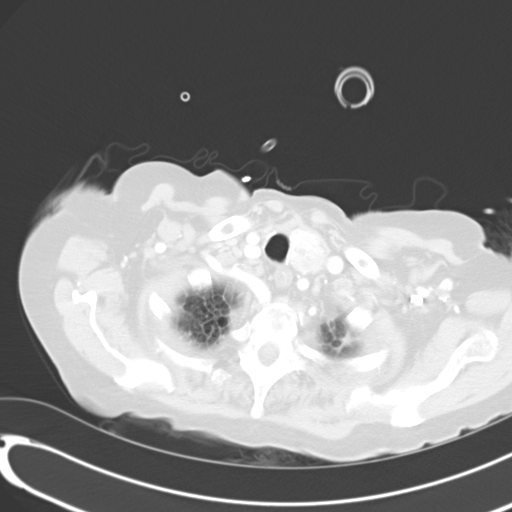

[Series 602: <mpr thick range> · coronal · 0.69mm/px · 3 of 76 slices shown]
[im 16/76  lung]
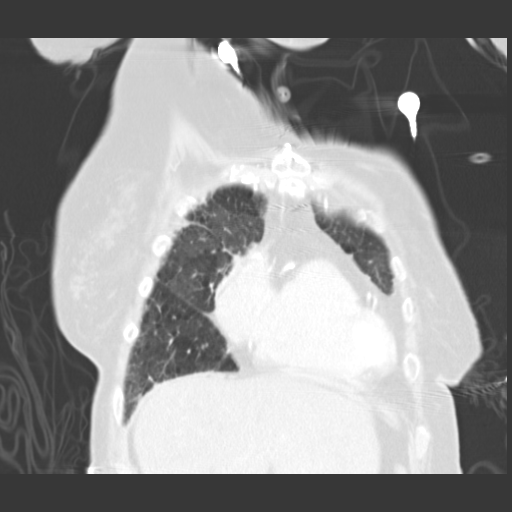
[im 31/76  lung]
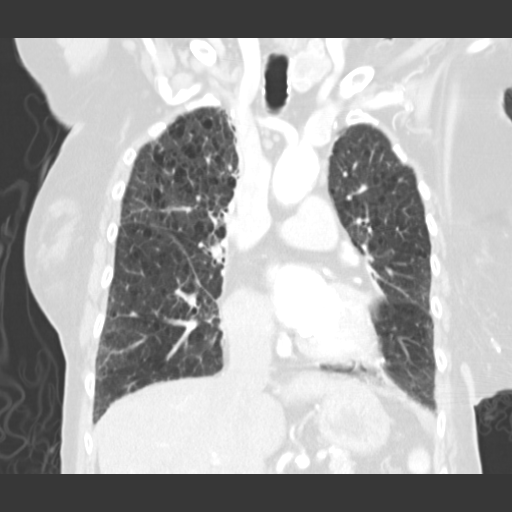
[im 46/76  lung]
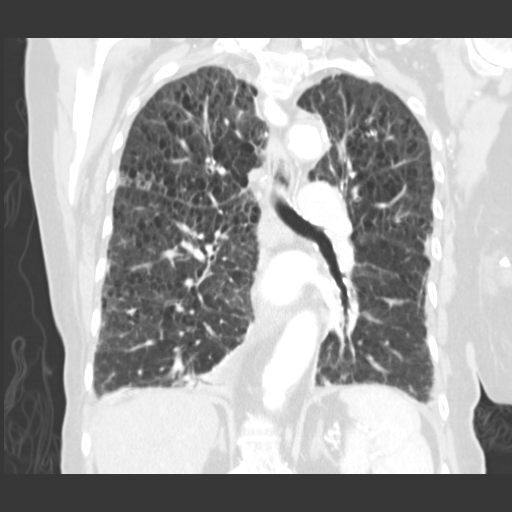

[15 of 36 positions shown; findings below may reference images not displayed]

FINDINGS: The patient has small bilateral pleural effusions.  There
is no axillary lymphadenopathy.  Scattered small mediastinal lymph
nodes are noted including a precarinal node on image 21 measuring
0.8 cm short axis dimension.  No pathologic lymphadenopathy by CT
size criteria is seen in the mediastinum.  1.5 cm right hilar lymph
node on image 26 noted.  Extensive coronary aortic atherosclerotic
vascular disease is seen.  No pericardial effusion.  Heart size is
normal.

The lungs demonstrate severe and diffuse centrilobular emphysema.
Approximately five calcified lesions are identified scattered in
both lungs likely representing calcified granulomas.  There is some
compressive atelectasis from the patient's effusions.  A subpleural
nodule is identified in the right lower lobe on image 36 of series
4 measuring 0.6 cm in diameter.  Patchy airspace opacity is seen in
the right upper lobe.

Incidentally imaged upper abdomen demonstrate some thickening of
the left adrenal gland compatible with hyperplasia.  Low
attenuation lesion in the dome of the liver in the left lobe
measuring 1.1 cm can be fully characterize but likely represents a
cyst.  There is no focal bony abnormality.
IMPRESSION: 1.  Dominant finding is severe and diffuse centrilobular emphysema.
2.  Mild patchy airspace disease in the right upper lobe could be
due to early bronchopneumonia.
3.  Scattered calcified pulmonary nodules are likely due to old
granulomatous disease.
4.  0.6 cm subpleural nodule on the right.  If the patient is at
high risk for bronchogenic carcinoma, follow-up chest CT at 6-12
months is recommended.  If the patient is at low risk for
bronchogenic carcinoma, follow-up chest CT at 12 months is
recommended.  This recommendation follows the consensus statement:
"Guidelines for Management of Small Pulmonary Nodules Detected on
CT Scans: A Statement from the [HOSPITAL]" as published in
[URL]
5.  Small bilateral pleural effusions.

## 2011-06-13 IMAGING — CR DG CHEST 1V PORT
1 series · 1 of 1 positions shown · non-contrast
Comparison: Two-view chest x-ray yesterday.

CLINICAL DATA: Shortness of breath.  Generalized weakness.

PORTABLE CHEST - 1 VIEW [DATE]/[id number] hours:

[view not recorded]
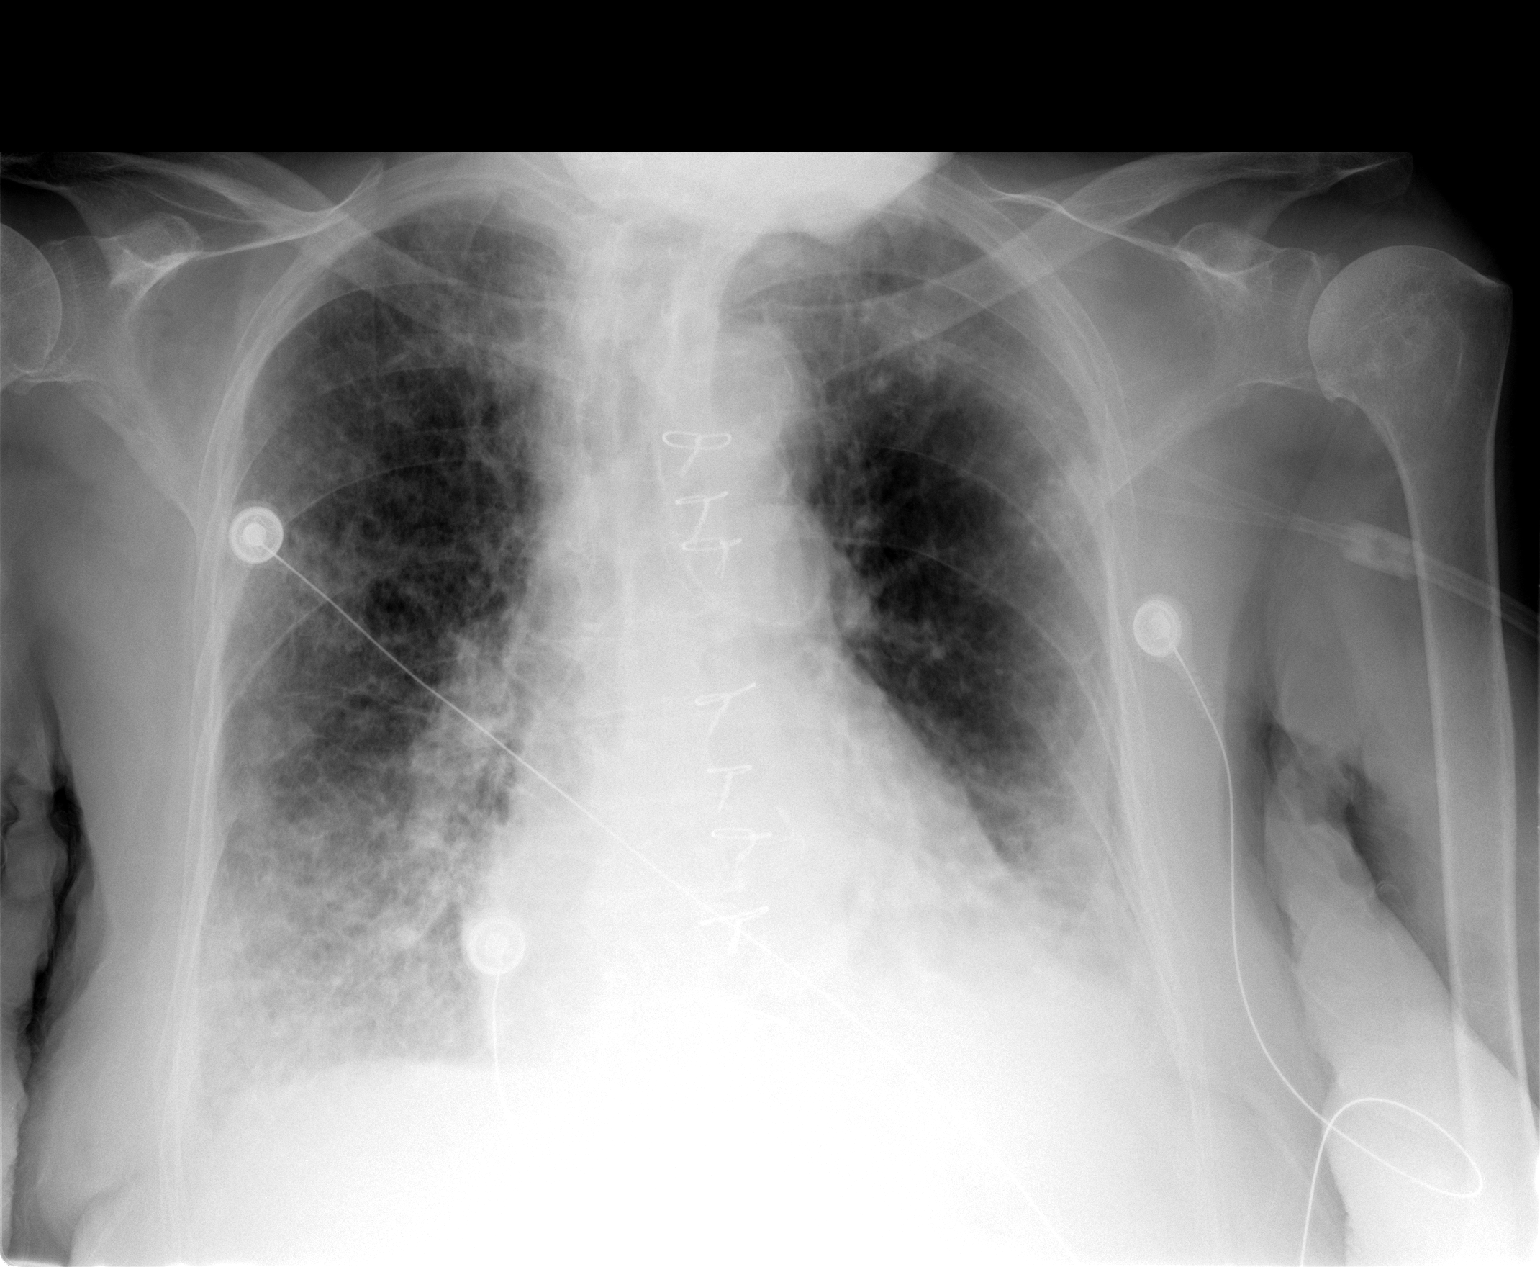

[1 of 1 positions shown; findings below may reference images not displayed]

FINDINGS: Prior sternotomy.  Heart enlarged.  Interval worsening of
interstitial and airspace pulmonary opacities consistent with
edema.  Bilateral pleural effusions, increased in size.  Associated
passive atelectasis at the left base.  Baseline interstitial
fibrosis is suspected.
IMPRESSION: Worsening interstitial and airspace pulmonary edema since yesterday
and enlarging bilateral pleural effusions.  Associated passive
atelectasis in the left lower lobe.  Baseline pulmonary fibrosis
suspected.

## 2014-08-16 ENCOUNTER — Telehealth: Payer: Self-pay

## 2014-08-16 NOTE — Telephone Encounter (Signed)
08/16/14 [facility] and filed on shelf.Jeneen Rinks/IH
